# Patient Record
Sex: Male | Born: 1990 | Race: Black or African American | Hispanic: No | Marital: Single | Smoking: Never smoker
Health system: Southern US, Community
[De-identification: ages and names within clinical notes are randomized; demographics above are authoritative.]

## PROBLEM LIST (undated history)

## (undated) DIAGNOSIS — T4145XA Adverse effect of unspecified anesthetic, initial encounter: Secondary | ICD-10-CM

## (undated) DIAGNOSIS — R011 Cardiac murmur, unspecified: Secondary | ICD-10-CM

## (undated) DIAGNOSIS — D573 Sickle-cell trait: Secondary | ICD-10-CM

## (undated) DIAGNOSIS — I10 Essential (primary) hypertension: Secondary | ICD-10-CM

## (undated) DIAGNOSIS — T8859XA Other complications of anesthesia, initial encounter: Secondary | ICD-10-CM

## (undated) HISTORY — DX: Cardiac murmur, unspecified: R01.1

---

## 2011-03-07 ENCOUNTER — Emergency Department (HOSPITAL_COMMUNITY)
Admission: EM | Admit: 2011-03-07 | Discharge: 2011-03-07 | Disposition: A | Discharge status: Home / Self Care | Payer: BC Managed Care – PPO | Source: Home / Self Care | Attending: Family Medicine | Admitting: Family Medicine

## 2011-03-07 ENCOUNTER — Encounter (HOSPITAL_COMMUNITY): Payer: Self-pay | Admitting: *Deleted

## 2011-03-07 DIAGNOSIS — I10 Essential (primary) hypertension: Secondary | ICD-10-CM

## 2011-03-07 DIAGNOSIS — B86 Scabies: Secondary | ICD-10-CM

## 2011-03-07 LAB — POCT URINALYSIS DIP (DEVICE)
Bilirubin Urine: NEGATIVE
Glucose, UA: NEGATIVE mg/dL
Hgb urine dipstick: NEGATIVE
Leukocytes, UA: NEGATIVE
Nitrite: NEGATIVE
Urobilinogen, UA: 2 mg/dL — ABNORMAL HIGH (ref 0.0–1.0)
pH: 8.5 — ABNORMAL HIGH (ref 5.0–8.0)

## 2011-03-07 LAB — POCT I-STAT, CHEM 8
Calcium, Ion: 1.26 mmol/L (ref 1.12–1.32)
Chloride: 105 mEq/L (ref 96–112)
Glucose, Bld: 85 mg/dL (ref 70–99)
HCT: 44 % (ref 39.0–52.0)
Hemoglobin: 15 g/dL (ref 13.0–17.0)

## 2011-03-07 MED ORDER — PERMETHRIN 5 % EX CREA: TOPICAL_CREAM | CUTANEOUS | Status: AC

## 2011-03-07 MED ORDER — HYDROCHLOROTHIAZIDE 25 MG PO TABS: 25.0000 mg | ORAL_TABLET | Freq: Every day | ORAL | Status: DC

## 2011-03-07 MED ORDER — PERMETHRIN 5 % EX CREA: TOPICAL_CREAM | Freq: Once | CUTANEOUS | Status: DC

## 2011-03-07 NOTE — ED Notes (Signed)
Pt  Reports  A  Rash        X  3  Weeks        With  Small  Lesions  Throughout        No  Known  Causative  Agents             No  New  meds        No  resp  Distress   Or  Any  Angio  Edema

## 2011-03-07 NOTE — ED Notes (Signed)
Pt  States  Several  Years  Ago  By  [facility] [facility]  Dr  That  His  bp  Was  High       But    Nothing  Was  Done

## 2011-03-07 NOTE — ED Provider Notes (Signed)
History     CSN: [id number]  Arrival date & time 03/07/11  1031   First MD Initiated Contact with Patient 03/07/11 1231      Chief Complaint  Patient presents with  . Rash    (Consider location/radiation/quality/duration/timing/severity/associated sxs/prior treatment) HPI Comments: Itching rash that is generalized x 3 wks. Developing lesion in web spaces of fingers. No treatment pta.   The history is provided by the patient.    History reviewed. No pertinent past medical history.  History reviewed. No pertinent past surgical history.  Family History  Problem Relation Age of Onset  . Hypertension Mother     History  Substance Use Topics  . Smoking status: Not on file  . Smokeless tobacco: Not on file  . Alcohol Use:       Review of Systems  Constitutional: Negative.   HENT: Negative.   Cardiovascular: Negative.   Gastrointestinal: Negative.   Genitourinary: Negative.   Musculoskeletal: Negative.   Neurological: Negative.     Allergies  Monoclonal antibodies and Penicillins  Home Medications   Current Outpatient Rx  Name Route Sig Dispense Refill  . DIPHENHYDRAMINE HCL 25 MG PO CAPS Oral Take 25 mg by mouth every 6 (six) hours as needed.      BP 166/110  Pulse 62  Temp(Src) 98.8 F (37.1 C) (Oral)  Resp 16  SpO2 99%  Physical Exam  Nursing note and vitals reviewed. Constitutional: He appears well-developed and well-nourished. No distress.       Blood pressure noted to be elevated. Will recheck  HENT:  Head: Normocephalic and atraumatic.  Neck: Normal range of motion. Neck supple. No thyromegaly present.  Cardiovascular: Normal rate and regular rhythm.   Pulmonary/Chest: Effort normal and breath sounds normal.  Lymphadenopathy:    He has no cervical adenopathy.  Skin: Skin is warm and dry.       Rash in web spaces on abd and thighs.    ED Course  Procedures (including critical care time)  Labs Reviewed  POCT URINALYSIS DIP (DEVICE) -  Abnormal; Notable for the following:    pH 8.5 (*)    Urobilinogen, UA 2.0 (*)    All other components within normal limits   No results found.   1. Scabies   2. Hypertension       MDM          Randa Spike, MD 03/07/11 1325

## 2011-06-22 ENCOUNTER — Observation Stay (HOSPITAL_COMMUNITY)
Admission: EM | Admit: 2011-06-22 | Discharge: 2011-06-23 | Disposition: A | Discharge status: Home / Self Care | Payer: BC Managed Care – PPO | Source: Ambulatory Visit | Attending: Emergency Medicine | Admitting: Emergency Medicine

## 2011-06-22 ENCOUNTER — Encounter (HOSPITAL_COMMUNITY): Payer: Self-pay | Admitting: Emergency Medicine

## 2011-06-22 DIAGNOSIS — D573 Sickle-cell trait: Secondary | ICD-10-CM | POA: Insufficient documentation

## 2011-06-22 DIAGNOSIS — N4830 Priapism, unspecified: Principal | ICD-10-CM | POA: Insufficient documentation

## 2011-06-22 HISTORY — DX: Essential (primary) hypertension: I10

## 2011-06-22 HISTORY — DX: Sickle-cell trait: D57.3

## 2011-06-22 MED ORDER — TERBUTALINE SULFATE 1 MG/ML IJ SOLN
0.2500 mg | Freq: Once | INTRAMUSCULAR | Status: AC
  Administered 2011-06-23: 0.25 mg via SUBCUTANEOUS
  Filled 2011-06-22: qty 1

## 2011-06-22 MED ORDER — HYDROMORPHONE HCL PF 1 MG/ML IJ SOLN
1.0000 mg | Freq: Once | INTRAMUSCULAR | Status: AC
  Administered 2011-06-23: 1 mg via INTRAVENOUS
  Filled 2011-06-22: qty 1

## 2011-06-22 NOTE — ED Notes (Addendum)
Pt reports having erection X 2 days unable to get to go away, denies abnormal discharge and denies dysuria

## 2011-06-22 NOTE — ED Provider Notes (Signed)
History     CSN: [id number]  Arrival date & time 06/22/11  2309   First MD Initiated Contact with Patient 06/22/11 2331      Chief Complaint  Patient presents with  . Priapism     (Consider location/radiation/quality/duration/timing/severity/associated sxs/prior treatment) HPI Comments: 21 year old male presents with a history of penile erection which has been present for longer than 36 hours. This was acute in onset, persistent, nothing makes this better or worse, the patient denies any substance abuse, is on no home or daily medications and does not have sickle cell disease though he does have sickle cell trait. This is associated with pain, he has had no pain medication prior to arrival. He denies a history of having this in the past.  He denies fevers, chills nausea vomiting dysuria diarrhea or urethral discharge. He has no testicular pain  The history is provided by the patient.    Past Medical History  Diagnosis Date  . Hypertension   . Sickle cell trait     History reviewed. No pertinent past surgical history.  Family History  Problem Relation Age of Onset  . Hypertension Mother     History  Substance Use Topics  . Smoking status: Never Smoker   . Smokeless tobacco: Not on file  . Alcohol Use: No      Review of Systems  Constitutional: Negative for fever and chills.  Gastrointestinal: Negative for nausea, vomiting and diarrhea.  Genitourinary: Positive for penile swelling and penile pain. Negative for hematuria, discharge, scrotal swelling, genital sores and testicular pain.       Priapism  Musculoskeletal: Negative for back pain.  Skin: Negative for rash.    Allergies  Monoclonal antibodies and Penicillins  Home Medications   Current Outpatient Rx  Name Route Sig Dispense Refill  . DIPHENHYDRAMINE HCL 25 MG PO CAPS Oral Take 25 mg by mouth every 6 (six) hours as needed.    Marland Kitchen HYDROCHLOROTHIAZIDE 25 MG PO TABS Oral Take 1 tablet (25 mg total) by mouth  daily. 30 tablet 0    BP 149/106  Pulse 52  Temp(Src) 98.1 F (36.7 C) (Oral)  Resp 16  SpO2 99%  Physical Exam  Constitutional: He appears well-developed and well-nourished. No distress.  HENT:  Head: Normocephalic and atraumatic.  Eyes: Conjunctivae are normal. No scleral icterus.  Pulmonary/Chest: Effort normal. No respiratory distress.  Abdominal: Soft. There is no tenderness.  Genitourinary:       Rigid enlarged penis without uretheral d/c, scrotal swelling or testicular tenderness  Musculoskeletal: He exhibits no edema and no tenderness.  Neurological: He is alert. Coordination normal.  Skin: Skin is warm. No rash noted. He is not diaphoretic.    ED Course  Procedures (including critical care time)  Labs Reviewed  POCT I-STAT 3, BLOOD GAS (G3P V) - Abnormal; Notable for the following:    pH, Ven 7.302 (*)    pCO2, Ven 50.7 (*)    pO2, Ven 58.0 (*)    Bicarbonate 25.0 (*)    All other components within normal limits  LAB REPORT - SCANNED   No results found.   1. Priapism       MDM  Priapism, unknown etiology - denies meds / drugs / stimulation - has SS train only.  Otherwise benign appearance.  Terbutaline ordered at 11:35   Attempted phenylephrine injections, no significant improvement in erection, discussed with urologist Dr. Annabell Howells, who will come to see the patient. IV fluids being given  Vida Roller, MD 06/24/11 [phone]

## 2011-06-23 ENCOUNTER — Encounter (HOSPITAL_COMMUNITY): Payer: Self-pay | Admitting: *Deleted

## 2011-06-23 DIAGNOSIS — N4830 Priapism, unspecified: Principal | ICD-10-CM | POA: Diagnosis present

## 2011-06-23 LAB — POCT I-STAT 3, VENOUS BLOOD GAS (G3P V)
Acid-base deficit: 2 mmol/L (ref 0.0–2.0)
pH, Ven: 7.302 — ABNORMAL HIGH (ref 7.250–7.300)

## 2011-06-23 MED ORDER — SODIUM CHLORIDE 0.9 % IV SOLN
Freq: Once | INTRAVENOUS | Status: AC
  Administered 2011-06-23: 10:00:00 via INTRAVENOUS

## 2011-06-23 MED ORDER — HYDROMORPHONE HCL PF 1 MG/ML IJ SOLN
1.0000 mg | Freq: Once | INTRAMUSCULAR | Status: AC
  Administered 2011-06-23: 1 mg via INTRAVENOUS
  Filled 2011-06-23: qty 1

## 2011-06-23 MED ORDER — PHENYLEPHRINE 200 MCG/ML FOR PRIAPISM / HYPOTENSION
50.0000 ug | Freq: Once | INTRAMUSCULAR | Status: AC
  Administered 2011-06-23: 50 ug via INTRAVENOUS
  Filled 2011-06-23: qty 50

## 2011-06-23 MED ORDER — ONDANSETRON HCL 4 MG/2ML IJ SOLN
4.0000 mg | INTRAMUSCULAR | Status: DC | PRN
  Administered 2011-06-23: 4 mg via INTRAVENOUS
  Filled 2011-06-23: qty 2

## 2011-06-23 MED ORDER — SODIUM CHLORIDE 0.9 % IV BOLUS (SEPSIS)
1000.0000 mL | Freq: Once | INTRAVENOUS | Status: AC
  Administered 2011-06-23: 1000 mL via INTRAVENOUS

## 2011-06-23 MED ORDER — PHENYLEPHRINE 200 MCG/ML FOR PRIAPISM / HYPOTENSION
500.0000 ug | INTRAMUSCULAR | Status: DC | PRN
  Administered 2011-06-23: 500 ug via INTRACAVERNOUS
  Filled 2011-06-23: qty 50

## 2011-06-23 MED ORDER — TERBUTALINE SULFATE 1 MG/ML IJ SOLN
0.5000 mg | Freq: Once | INTRAMUSCULAR | Status: AC
  Administered 2011-06-23: 0.5 mg via SUBCUTANEOUS
  Filled 2011-06-23: qty 1

## 2011-06-23 MED ORDER — HYDROMORPHONE HCL PF 1 MG/ML IJ SOLN
1.0000 mg | Freq: Once | INTRAMUSCULAR | Status: DC
  Filled 2011-06-23: qty 1

## 2011-06-23 MED ORDER — LIDOCAINE-EPINEPHRINE 1 %-1:100000 IJ SOLN
INTRAMUSCULAR | Status: AC
  Filled 2011-06-23: qty 1

## 2011-06-23 NOTE — ED Notes (Signed)
Pt's erection has not subsided. MD aware.

## 2011-06-23 NOTE — ED Provider Notes (Signed)
Patient care resumed from Dr. Hyacinth Meeker.  Patient presented to emergency department with priapism and hypertension.  Dr. Annabell Howells has evaluated patient in the emergency department and attempted irrigation.  Patient still in pain with the errect penis.  I have paged and spoken with Dr. Isabel Caprice of urology who states that he is currently at [facility] but will be heading to [facility] for a reattempt.  Patient will be moved to CDU for observation and I have ordered phenylephrine, 18-gauge butterfly, normal saline flushes and an irrigation kit to be prepared at patient's bedside for Dr. Ellin Goodie arrival.  Patient's pain is currently being managed with Dilaudid.  10:41 AM Pt still with erection. Dr Despina Pole questions high flow pressure priapism and pt is likely to be transferred to [facility] for IR guided irrigation.pt pain being managed in ED.   11:54 AM [place] Urologist is not comfortable with the procedure needed. Dr. Isabel Caprice is to speak with the urologist at [facility]. Another Dilaudid ordered.  Pt care resumed by catherine Sawdey PA-C who will work on transfer to [facility].    Jaci Carrel, [place] 06/24/11 [phone]

## 2011-06-23 NOTE — Progress Notes (Addendum)
Patient ID: Dustin Mendoza, male   DOB: Nov 30, 1990, 20 y.o.   MRN: [id number]   Subjective: Patient reports ongoing priapism x48 hours. Full consult an initial procedure note done by Dr. Bjorn Mendoza our practice.  Objective: Vital signs in last 24 hours: Temp:  [98.1 F (36.7 C)-99 F (37.2 C)] 98.1 F (36.7 C) (05/26 0937) Pulse Rate:  [48-65] 59  (05/26 1049) Resp:  [14-20] 16  (05/26 1049) BP: (147-180)/(97-123) 158/107 mmHg (05/26 1049) SpO2:  [99 %-100 %] 99 % (05/26 1049)  Intake/Output from previous day: 05/25 0701 - 05/26 0700 In: -  Out: 400 [Urine:400] Intake/Output this shift: Total I/O In: 1000 [I.V.:1000] Out: 400 [Urine:400]  Physical Exam:  Constitutional: Vital signs reviewed. WD WN in NAD   Eyes: PERRL, No scleral icterus.   Cardiovascular: RRR Pulmonary/Chest: Normal effort Abdominal: Soft. Non-tender, non-distended, bowel sounds are normal, no masses, organomegaly, or guarding present.  Genitourinary: Full tumescence with tenderness. No evidence of necrosis ischemia or obvious trauma. Extremities: No cyanosis or edema   Lab Results: No results found for this basename: HGB:3,HCT:3 in the last 72 hours BMET No results found for this basename: NA:2,K:2,CL:2,CO2:2,GLUCOSE:2,BUN:2,CREATININE:2,CALCIUM:2 in the last 72 hours No results found for this basename: LABPT:3,INR:3 in the last 72 hours No results found for this basename: LABURIN:1 in the last 72 hours No results found for this or any previous visit.  Studies/Results: No results found.  Assessment/Plan:   We found that Dustin Mendoza had 48 hours of priapism with full tumescence. While he is sickle trait there were no other obvious risk factors for priapism. He denied any penile trauma and had no illicit drug use or other systemic medical problems. We initiated his care with additional assessment with aspiration of the corpora. We found the blood to be under extremely high pressure with a stream of  blood leaving the 18-gauge needle and shooting out at least 8-10 inches. The blood did not appear as dark as one would expect with a typical ischemic priapism. Some additional irrigation was performed with saline. We did get a partial detumescence for just a few seconds and then the penis with become completely rigid again. Phenylephrine had previously been tried by both the ER physicians as well as my partner. A blood gas was obtained on the corporal blood and unfortunately fell in the equivocal zone between an ischemic and nonischemic state. This clearly however was not a typical ischemic priapism in my estimation. I could feel a pulsation in his penis and was quite concerned about the amount of pressure that I was seen within the corporal bodies and again was concerned that this may indeed be more of a high flow state. The patient clearly had not responded to multiple attempts at irrigation and also phenylephrine. I do not however feel comfortable taking him to the OR for a typical shunt procedure. I have limited to no experience doing shunt procedures for priapism. I felt special comfortable taken to surgery given the question of the etiology and the possibility that this may be a high flow priapism I did not feel that interventional radiology would feel comfortable emboli seen in a situation like this again without a more definitive diagnosis and also given the patient's current age. I did discuss this with the urology resident at [facility]. I explained my concerns about this case and felt that this would be better managed at definitive academic institution. They've agreed to accept the transfer.   LOS: 1 day   Dustin Mendoza  S 06/23/2011, 11:35 AM   I spent approximately an hour at the patient's bedside and then an additional 30 minutes facilitating this patient's transfer.  After spending considerable time discussing things with the chief resident at [facility] we were told that this patient would be  accepted in transfer. They socially call me back approximately 45 minutes later and told me that the attending physician is a pediatric urologist he does not feel comfortable managing this problem and suggested we try a different academic center.

## 2011-06-23 NOTE — ED Notes (Signed)
PT's penis is still erect but less swelling than presentation.

## 2011-06-23 NOTE — ED Notes (Signed)
Erection has not improved; MD aware.

## 2011-06-23 NOTE — Procedures (Signed)
  Procedure:  Aspiration and irrigation of corpora.  Dx: Priapism with sickle cell trait.  Surgeon: Bjorn Pippin MD.  Anesth: Local with 1% lidocaine with epi.  Comp: none.  Indications: 20yo BM with priapism that didn't respond to phenylepherine injections.  Procedure:  The penis was prepped with betadine and draped with towels.  A 14fr butterfly was placed in the left mid corpora and attached to a 3 way stop cock with a 20cc syringe and 10cc syringe with 10cc of 1% lidocaine with epi.  2cc of lidocaine was injected and then after an interval the corpora was aspirated.  The aspirated blood was quite dark.  This process was repeat using about 5cc of the lidocaine solution.  About 100cc of blood was removed with partial detumescence.   I then placed a second butterfly in the right corpora and repeated the process with the remaining lidocaine.  About 40cc of blood was removed with further detumescence.  I was only about to get about a 20% reduction in the erection.  I then injected 1.5cc of phenylepherine 1/100000 in each corpora.   There was mild further detumescence with that.   I removed the needles and placed a dressing of kling and coban.  I will have him vigorously hydrated and give him oxygen therapy.  He will be observed and if there is no further improvement, he may need to go to the OR for further management and possible shunting.

## 2011-06-23 NOTE — ED Notes (Signed)
MD at bedside. Priapism kit retrieved; RN at bedside to assist. Pt tolerating well.

## 2011-06-23 NOTE — Consult Note (Signed)
Reason for Consult:Priapism Referring Physician: Eber Hong MD   [name] is an 21 y.o. male.  HPI: Dustin Mendoza is a 21 yo BM with sickle cell trait who had failure of detumescence about 36hrs ago.  He denies drug use or high risk medications.   He tried ice packs without success and came to the ER where phenylepherine injections were unsuccessfull.  He continues to have a painful erection.  He is voiding without complaints.  Past Medical History  Diagnosis Date  . Hypertension     History reviewed. No pertinent past surgical history.  Family History  Problem Relation Age of Onset  . Hypertension Mother     Social History:  reports that he has never smoked. He does not have any smokeless tobacco history on file. He reports that he does not drink alcohol or use illicit drugs.  Allergies:  Allergies  Allergen Reactions  . Monoclonal Antibodies   . Penicillins     Medications: I have reviewed the patient's current medications.  He takes an antihypertensive but doesn't know what it is.  No results found for this or any previous visit (from the past 48 hour(s)).  No results found.  Review of Systems  Genitourinary:       Penile pain  All other systems reviewed and are negative.   Blood pressure 147/97, pulse 65, temperature 99 F (37.2 C), temperature source Oral, resp. rate 18, SpO2 99.00%. Physical Exam  Constitutional: He is oriented to person, place, and time. He appears well-developed and well-nourished.  HENT:  Head: Normocephalic and atraumatic.  Cardiovascular: Normal rate and regular rhythm.   Respiratory: Effort normal and breath sounds normal.  GI: Soft. There is no tenderness.  Genitourinary: Penile tenderness (with a full erection that is tender to touch) present.  Musculoskeletal: Normal range of motion.  Neurological: He is alert and oriented to person, place, and time.  Skin: Skin is warm and dry.  Psychiatric: He has a normal mood and affect.     Assessment/Plan: Priapism with sickle cell trait.  I aspirated and irrigated his corpora with only mild detumescence. I will have him vigorously hydrated and give him O2 and if the erection doesn't resolve, he will need to be taken to the OR for further treatment.   Dustin Mendoza J 06/23/2011, 6:36 AM

## 2011-06-23 NOTE — ED Notes (Signed)
MD at bedside. 

## 2011-06-23 NOTE — ED Notes (Signed)
MD done with pt; oxygen placed; IV fluids placed.

## 2011-06-24 NOTE — ED Provider Notes (Signed)
Medical screening examination/treatment/procedure(s) were performed by non-physician practitioner and as supervising physician I was immediately available for consultation/collaboration.   Carleene Cooper III, MD 06/24/11 [phone]

## 2013-01-28 HISTORY — PX: PRIAPISM REPAIR: SHX6040

## 2013-08-09 ENCOUNTER — Ambulatory Visit: Payer: BC Managed Care – PPO

## 2013-08-09 ENCOUNTER — Ambulatory Visit (INDEPENDENT_AMBULATORY_CARE_PROVIDER_SITE_OTHER): Payer: BC Managed Care – PPO | Admitting: Family Medicine

## 2013-08-09 VITALS — BP 150/84 | HR 56 | Temp 97.9°F | Resp 16 | Ht 69.0 in | Wt 181.0 lb

## 2013-08-09 DIAGNOSIS — R079 Chest pain, unspecified: Secondary | ICD-10-CM

## 2013-08-09 DIAGNOSIS — I10 Essential (primary) hypertension: Secondary | ICD-10-CM

## 2013-08-09 DIAGNOSIS — S20219A Contusion of unspecified front wall of thorax, initial encounter: Secondary | ICD-10-CM

## 2013-08-09 MED ORDER — CYCLOBENZAPRINE HCL 5 MG PO TABS: 5.0000 mg | ORAL_TABLET | Freq: Three times a day (TID) | ORAL | Status: DC | PRN

## 2013-08-09 MED ORDER — AMLODIPINE BESYLATE 5 MG PO TABS: 5.0000 mg | ORAL_TABLET | Freq: Every day | ORAL | Status: DC

## 2013-08-09 MED ORDER — IBUPROFEN 600 MG PO TABS: 600.0000 mg | ORAL_TABLET | Freq: Three times a day (TID) | ORAL | Status: DC | PRN

## 2013-08-09 NOTE — Patient Instructions (Signed)
Hypertension Hypertension is another name for high blood pressure. High blood pressure forces your heart to work harder to pump blood. A blood pressure reading has two numbers, which includes a higher number over a lower number (example: 110/72). HOME CARE   Have your blood pressure rechecked by your doctor.  Only take medicine as told by your doctor. Follow the directions carefully. The medicine does not work as well if you skip doses. Skipping doses also puts you at risk for problems.  Do not smoke.  Monitor your blood pressure at home as told by your doctor. GET HELP IF:  You think you are having a reaction to the medicine you are taking.  You have repeat headaches or feel dizzy.  You have puffiness (swelling) in your ankles.  You have trouble with your vision. GET HELP RIGHT AWAY IF:   You get a very bad headache and are confused.  You feel weak, numb, or faint.  You get chest or belly (abdominal) pain.  You throw up (vomit).  You cannot breathe very well. MAKE SURE YOU:   Understand these instructions.  Will watch your condition.  Will get help right away if you are not doing well or get worse. Document Released: 07/03/2007 Document Revised: 01/19/2013 Document Reviewed: 11/06/2012 [facility] Patient Information 2015 [place], [place]. This information is not intended to replace advice given to you by your health care provider. Make sure you discuss any questions you have with your health care provider. Chest Contusion A chest contusion is a deep bruise on your chest area. Contusions are the result of an injury that caused bleeding under the skin. A chest contusion may involve bruising of the skin, muscles, or ribs. The contusion may turn blue, purple, or yellow. Minor injuries will give you a painless contusion, but more severe contusions may stay painful and swollen for a few weeks. CAUSES  A contusion is usually caused by a blow, trauma, or direct force to an area of  the body. SYMPTOMS   Swelling and redness of the injured area.  Discoloration of the injured area.  Tenderness and soreness of the injured area.  Pain. DIAGNOSIS  The diagnosis can be made by taking a history and performing a physical exam. An X-ray, CT scan, or MRI may be needed to determine if there were any associated injuries, such as broken bones (fractures) or internal injuries. TREATMENT  Often, the best treatment for a chest contusion is resting, icing, and applying cold compresses to the injured area. Deep breathing exercises may be recommended to reduce the risk of pneumonia. Over-the-counter medicines may also be recommended for pain control. HOME CARE INSTRUCTIONS   Put ice on the injured area.  Put ice in a plastic bag.  Place a towel between your skin and the bag.  Leave the ice on for 15-20 minutes, 03-04 times a day.  Only take over-the-counter or prescription medicines as directed by your caregiver. Your caregiver may recommend avoiding anti-inflammatory medicines (aspirin, ibuprofen, and naproxen) for 48 hours because these medicines may increase bruising.  Rest the injured area.  Perform deep-breathing exercises as directed by your caregiver.  Stop smoking if you smoke.  Do not lift objects over 5 pounds (2.3 kg) for 3 days or longer if recommended by your caregiver. SEEK IMMEDIATE MEDICAL CARE IF:   You have increased bruising or swelling.  You have pain that is getting worse.  You have difficulty breathing.  You have dizziness, weakness, or fainting.  You have blood  in your urine or stool.  You cough up or vomit blood.  Your swelling or pain is not relieved with medicines. MAKE SURE YOU:   Understand these instructions.  Will watch your condition.  Will get help right away if you are not doing well or get worse. Document Released: 10/09/2000 Document Revised: 10/09/2011 Document Reviewed: 07/08/2011 [facility] Patient Information 2015  [place], [place]. This information is not intended to replace advice given to you by your health care provider. Make sure you discuss any questions you have with your health care provider.

## 2013-08-09 NOTE — Progress Notes (Signed)
Chief Complaint:  Chief Complaint  Patient presents with  . Chest Injury    from playing basketball, ran into another player, right side     HPI: Dustin Mendoza is a 23 y.o. male who is here for midsubsternal CP radiatin gto right side since yesterday , occurred after was palying basketball and another player elbowed him in the chest. He has sharp and throbbing pain, worse with ROM, achey when he tried to go to sleep last night. No SOB, no heart palpations. No nausea or vomiting. He has tried extra strength generic acetaminophen and painr eleiver cream on it. No prior CP. No tobacco use or illicit drugs. 8-9/10 when he rotates his torso , mostly center of chest and radiates to the right  He has a h/o HTN, has been out for a while. He does notwant to come in to get his blood work done regular. HE was on HCTZ previously and it made him dizzy so that also prompted him to stop after his refills ran out BP Readings from Last 3 Encounters:  08/09/13 150/84  06/23/11 149/106  03/07/11 166/110      Past Medical History  Diagnosis Date  . Hypertension   . Sickle cell trait   . Heart murmur    No past surgical history on file. History   Social History  . Marital Status: Single    Spouse Name: N/A    Number of Children: N/A  . Years of Education: N/A   Social History Main Topics  . Smoking status: Never Smoker   . Smokeless tobacco: None  . Alcohol Use: No  . Drug Use: No  . Sexual Activity: None   Other Topics Concern  . None   Social History Narrative  . None   Family History  Problem Relation Age of Onset  . Hypertension Mother    Allergies  Allergen Reactions  . Monoclonal Antibodies   . Penicillins    Prior to Admission medications   Medication Sig Start Date End Date Taking? Authorizing Provider  diphenhydrAMINE (BENADRYL) 25 mg capsule Take 25 mg by mouth every 6 (six) hours as needed.    Historical Provider, MD  hydrochlorothiazide (HYDRODIURIL) 25  MG tablet Take 1 tablet (25 mg total) by mouth daily. 03/07/11 03/06/12  Luna Glasgow, DO     ROS: The patient denies fevers, chills, night sweats, unintentional weight loss,  palpitations, wheezing, dyspnea on exertion, nausea, vomiting, abdominal pain, dysuria, hematuria, melena, numbness, weakness, or tingling.   All other systems have been reviewed and were otherwise negative with the exception of those mentioned in the HPI and as above.    PHYSICAL EXAM: Filed Vitals:   08/09/13 1143  BP: 150/84  Pulse: 56  Temp: 97.9 F (36.6 C)  Resp: 16   Filed Vitals:   08/09/13 1143  Height: 5\' 9"  (1.753 m)  Weight: 181 lb (82.101 kg)   Body mass index is 26.72 kg/(m^2).  General: Alert, no acute distress HEENT:  Normocephalic, atraumatic, oropharynx patent. EOMI, PERRLA Cardiovascular:  Regular rate and rhythm, no rubs murmurs or gallops.  No Carotid bruits, radial pulse intact. No pedal edema. + chest wall tenderness at xiphoid and right anterior chest Respiratory: Clear to auscultation bilaterally.  No wheezes, rales, or rhonchi.  No cyanosis, no use of accessory musculature GI: No organomegaly, abdomen is soft and non-tender, positive bowel sounds.  No masses. Skin: No rashes. Neurologic: Facial musculature symmetric. Psychiatric: Patient is appropriate throughout  our interaction. Lymphatic: No cervical lymphadenopathy Musculoskeletal: Gait intact.   LABS: Results for orders placed during the hospital encounter of 06/22/11  POCT I-STAT 3, BLOOD GAS (G3P V)      Result Value Ref Range   pH, Ven 7.302 (*) 7.250 - 7.300   pCO2, Ven 50.7 (*) 45.0 - 50.0 mmHg   pO2, Ven 58.0 (*) 30.0 - 45.0 mmHg   Bicarbonate 25.0 (*) 20.0 - 24.0 mEq/L   TCO2 27  0 - 100 mmol/L   O2 Saturation 86.0     Acid-base deficit 2.0  0.0 - 2.0 mmol/L   Drawn by :MD     Sample type MIXED VENOUS SAMPLE       EKG/XRAY:   Primary read interpreted by Dr. Marin Comment at [facility]. Please comment on right perihilar  vertical hypodense line  Borderline  cardiomegaly, no effusion, infiltrates   ASSESSMENT/PLAN: Encounter Diagnoses  Name Primary?  . Chest pain, unspecified chest pain type Yes  . Essential hypertension   . Chest wall contusion, unspecified laterality, initial encounter    HE declined blood work so will not give lisinopril  HE was dizzy on HCTZ He would like norvasc, gave him HR and also BP precautions and parameters.  Chest wall pain is secondary to contusion , rx ibuprofen and also flexeril, declined anythign stronger F/u in 1 month    Gross sideeffects, risk and benefits, and alternatives of medications d/w patient. Patient is aware that all medications have potential sideeffects and we are unable to predict every sideeffect or drug-drug interaction that may occur.  Brookelyn Gaynor, [place], DO 08/09/2013 1:15 PM

## 2013-11-26 ENCOUNTER — Encounter (HOSPITAL_COMMUNITY): Payer: Self-pay | Admitting: Emergency Medicine

## 2013-11-26 ENCOUNTER — Emergency Department (HOSPITAL_COMMUNITY)
Admission: EM | Admit: 2013-11-26 | Discharge: 2013-11-26 | Disposition: A | Discharge status: Home / Self Care | Payer: BC Managed Care – PPO | Attending: Emergency Medicine | Admitting: Emergency Medicine

## 2013-11-26 DIAGNOSIS — R011 Cardiac murmur, unspecified: Secondary | ICD-10-CM | POA: Insufficient documentation

## 2013-11-26 DIAGNOSIS — Z862 Personal history of diseases of the blood and blood-forming organs and certain disorders involving the immune mechanism: Secondary | ICD-10-CM | POA: Insufficient documentation

## 2013-11-26 DIAGNOSIS — J029 Acute pharyngitis, unspecified: Secondary | ICD-10-CM | POA: Diagnosis present

## 2013-11-26 DIAGNOSIS — Z79899 Other long term (current) drug therapy: Secondary | ICD-10-CM | POA: Diagnosis not present

## 2013-11-26 DIAGNOSIS — I10 Essential (primary) hypertension: Secondary | ICD-10-CM | POA: Diagnosis not present

## 2013-11-26 DIAGNOSIS — Z880 Allergy status to penicillin: Secondary | ICD-10-CM | POA: Insufficient documentation

## 2013-11-26 LAB — RAPID STREP SCREEN (MED CTR MEBANE ONLY): Streptococcus, Group A Screen (Direct): NEGATIVE

## 2013-11-26 MED ORDER — HYDROCODONE-ACETAMINOPHEN 7.5-325 MG/15ML PO SOLN: 15.0000 mL | Freq: Four times a day (QID) | ORAL | Status: DC | PRN

## 2013-11-26 MED ORDER — ACETAMINOPHEN 160 MG/5ML PO SOLN
650.0000 mg | Freq: Once | ORAL | Status: AC
  Administered 2013-11-26: 650 mg via ORAL
  Filled 2013-11-26: qty 20.3

## 2013-11-26 NOTE — Discharge Instructions (Signed)

## 2013-11-26 NOTE — ED Provider Notes (Signed)
CSN: [id number]     Arrival date & time 11/26/13  1345 History  This chart was scribed for non-physician practitioner, Lorre Munroe, PA-C,working with Richarda Blade, MD, by Marlowe Kays, ED Scribe. This patient was seen in room TR09C/TR09C and the patient's care was started at 2:01 PM.  Chief Complaint  Patient presents with  . Sore Throat   The history is provided by the patient. No language interpreter was used.   HPI Comments:  Dustin Mendoza is a 23 y.o. male with PMH of HTN who presents to the Emergency Department complaining of a severe sore throat that began upon waking this morning. He states his tonsils are swollen as well. He has not taken anything for his symptoms. He states swallowing makes the pain worse. No alleviating factors were reported. Denies fever, chills, nausea, vomiting, cough or inability to swallow.  Past Medical History  Diagnosis Date  . Hypertension   . Sickle cell trait   . Heart murmur    History reviewed. No pertinent past surgical history. Family History  Problem Relation Age of Onset  . Hypertension Mother    History  Substance Use Topics  . Smoking status: Never Smoker   . Smokeless tobacco: Not on file  . Alcohol Use: No    Review of Systems  Constitutional: Negative for fever and chills.  HENT: Positive for sore throat. Negative for trouble swallowing.   Respiratory: Negative for cough and shortness of breath.   Gastrointestinal: Negative for nausea and vomiting.    Allergies  Monoclonal antibodies and Penicillins  Home Medications   Prior to Admission medications   Medication Sig Start Date End Date Taking? Authorizing Provider  amLODipine (NORVASC) 5 MG tablet Take 1 tablet (5 mg total) by mouth daily. Take 1/2 tab daily for 1 week then if blood pressure and heart rate tolerates then increase to 1 tab po daily. BP goal is less than 140/90 08/09/13   Thao P Le, DO  cyclobenzaprine (FLEXERIL) 5 MG tablet Take 1 tablet (5 mg  total) by mouth 3 (three) times daily as needed for muscle spasms. 08/09/13   Thao P Le, DO  ibuprofen (ADVIL,MOTRIN) 600 MG tablet Take 1 tablet (600 mg total) by mouth every 8 (eight) hours as needed. 08/09/13   Thao P Le, DO   Triage Vitals: BP 162/106  Pulse 112  Temp(Src) 97.9 F (36.6 C)  Resp 18  Wt 185 lb (83.915 kg)  SpO2 98% Physical Exam  Nursing note and vitals reviewed. Constitutional: He is oriented to person, place, and time. He appears well-developed and well-nourished.  HENT:  Head: Normocephalic and atraumatic.  Mouth/Throat: Uvula is midline and mucous membranes are normal. Posterior oropharyngeal erythema present. No tonsillar abscesses.  Oropharynx erythematous. No exudate. No peritonsillar abscess. Airway intact.  Eyes: EOM are normal.  Neck: Normal range of motion.  Cardiovascular: Normal rate, regular rhythm and normal heart sounds.  Exam reveals no gallop and no friction rub.   No murmur heard. Pulmonary/Chest: Effort normal and breath sounds normal. No respiratory distress. He has no wheezes. He has no rales.  Musculoskeletal: Normal range of motion.  Neurological: He is alert and oriented to person, place, and time.  Skin: Skin is warm and dry.  Psychiatric: He has a normal mood and affect. His behavior is normal.    ED Course  Procedures (including critical care time) DIAGNOSTIC STUDIES: Oxygen Saturation is 98% on RA, normal by my interpretation.   COORDINATION OF CARE: 2:03 PM-  Will order rapid strep test and order Tylenol for pain. Pt verbalizes understanding and agrees to plan.  Medications  acetaminophen (TYLENOL) solution 650 mg (650 mg Oral Given 11/26/13 1414)    Labs Review Labs Reviewed  RAPID STREP SCREEN  CULTURE, GROUP A STREP    Imaging Review No results found.   EKG Interpretation None      MDM   Final diagnoses:  Viral pharyngitis   Pt afebrile without tonsillar exudate, negative strep. Presents with mild cervical  lymphadenopathy, & dysphagia; diagnosis of viral pharyngitis. No abx indicated. DC w symptomatic tx for pain  Pt does not appear dehydrated, but did discuss importance of water rehydration. Presentation non concerning for PTA or infxn spread to soft tissue. No trismus or uvula deviation. Specific return precautions discussed. Pt able to drink water in ED without difficulty with intact air way. Recommended PCP follow up.   I personally performed the services described in this documentation, which was scribed in my presence. The recorded information has been reviewed and is accurate.    Montine Circle, PA-C 11/26/13 [phone]

## 2013-11-26 NOTE — ED Notes (Signed)
Per pt sts sore throat since this am. sts hard to swallow.

## 2013-11-28 LAB — CULTURE, GROUP A STREP

## 2015-11-03 ENCOUNTER — Encounter (HOSPITAL_COMMUNITY): Payer: Self-pay | Admitting: Vascular Surgery

## 2015-11-03 ENCOUNTER — Emergency Department (HOSPITAL_COMMUNITY)
Admission: EM | Admit: 2015-11-03 | Discharge: 2015-11-03 | Disposition: A | Discharge status: Home / Self Care | Payer: BLUE CROSS/BLUE SHIELD | Attending: Emergency Medicine | Admitting: Emergency Medicine

## 2015-11-03 DIAGNOSIS — I10 Essential (primary) hypertension: Secondary | ICD-10-CM | POA: Insufficient documentation

## 2015-11-03 DIAGNOSIS — S0993XA Unspecified injury of face, initial encounter: Secondary | ICD-10-CM | POA: Diagnosis present

## 2015-11-03 DIAGNOSIS — Y999 Unspecified external cause status: Secondary | ICD-10-CM | POA: Diagnosis not present

## 2015-11-03 DIAGNOSIS — Y939 Activity, unspecified: Secondary | ICD-10-CM | POA: Diagnosis not present

## 2015-11-03 DIAGNOSIS — X58XXXA Exposure to other specified factors, initial encounter: Secondary | ICD-10-CM | POA: Insufficient documentation

## 2015-11-03 DIAGNOSIS — Y929 Unspecified place or not applicable: Secondary | ICD-10-CM | POA: Insufficient documentation

## 2015-11-03 DIAGNOSIS — Z79899 Other long term (current) drug therapy: Secondary | ICD-10-CM | POA: Diagnosis not present

## 2015-11-03 NOTE — ED Notes (Signed)
Pt verbalized understanding of d/c instructions and has no furthe questions. Pt stable and NAD. Pt given follow up to ENT

## 2015-11-03 NOTE — ED Triage Notes (Signed)
Pt reports to the ED for eval of injury lingual frenulum. States last week he ate a chicken tender sandwich and it clipped his frenulum and cut it and it was bleeding very bad then today he ate something and it started bleeding again. No bleeding noted at this time.

## 2015-11-03 NOTE — ED Provider Notes (Signed)
[facility] DEPT Provider Note   CSN: [id number] Arrival date & time: 11/03/15  1646  By signing my name below, I, Reola Mosher, attest that this documentation has been prepared under the direction and in the presence of Etta Quill, NP. Electronically Signed: Reola Mosher, ED Scribe. 11/03/15. 6:32 PM.  History   Chief Complaint Chief Complaint  Patient presents with  . Mouth Injury   The history is provided by the patient. No language interpreter was used.  Mouth Injury  This is a new problem. The current episode started yesterday. The problem occurs constantly. The problem has been gradually worsening. Pertinent negatives include no chest pain, no abdominal pain, no headaches and no shortness of breath. The symptoms are aggravated by eating. Nothing relieves the symptoms. He has tried nothing for the symptoms. The treatment provided no relief.   HPI Comments: Dustin Mendoza is a 25 y.o. male who presents to the Emergency Department complaining of wound sustained to the frenulum of the tongue ~1 day ago. Pt reports that he was eating a chicken tender yesterday when it came across the bottom of his tongue, sustaining his wound. He further reports that he was eating a piece of bread this morning, which seemed to worsen the wound to the area. He denies biting his tongue. No treatments were tried prior to coming into the ED. His pain to the area is worsened with eating other foods. No other associated symptoms or complaints.    Past Medical History:  Diagnosis Date  . Heart murmur   . Hypertension   . Sickle cell trait [facility])    Patient Active Problem List   Diagnosis Date Noted  . Priapism 06/23/2011   History reviewed. No pertinent surgical history.  Home Medications    Prior to Admission medications   Medication Sig Start Date End Date Taking? Authorizing Provider  amLODipine (NORVASC) 5 MG tablet Take 1 tablet (5 mg total) by mouth daily. Take 1/2 tab daily  for 1 week then if blood pressure and heart rate tolerates then increase to 1 tab po daily. BP goal is less than 140/90 08/09/13   Thao P Le, DO  cyclobenzaprine (FLEXERIL) 5 MG tablet Take 1 tablet (5 mg total) by mouth 3 (three) times daily as needed for muscle spasms. 08/09/13   Thao P Le, DO  HYDROcodone-acetaminophen (HYCET) 7.5-325 mg/15 ml solution Take 15 mLs by mouth every 6 (six) hours as needed for moderate pain or severe pain. 11/26/13   Montine Circle, PA-C  ibuprofen (ADVIL,MOTRIN) 600 MG tablet Take 1 tablet (600 mg total) by mouth every 8 (eight) hours as needed. 08/09/13   Thao P Le, DO   Family History Family History  Problem Relation Age of Onset  . Hypertension Mother    Social History Social History  Substance Use Topics  . Smoking status: Never Smoker  . Smokeless tobacco: Never Used  . Alcohol use Yes     Comment: occasionally   Allergies   Monoclonal antibodies and Penicillins  Review of Systems Review of Systems  Respiratory: Negative for shortness of breath.   Cardiovascular: Negative for chest pain.  Gastrointestinal: Negative for abdominal pain.  Skin: Positive for wound.  Neurological: Negative for headaches.  All other systems reviewed and are negative.  Physical Exam Updated Vital Signs BP 153/97 (BP Location: Right Arm)   Pulse (!) 51   Temp 98.2 F (36.8 C) (Oral)   Resp 18   SpO2 100%   Physical Exam  Constitutional: He appears well-developed and well-nourished.  HENT:  Head: Normocephalic.  Right Ear: External ear normal.  Left Ear: External ear normal.  Mouth/Throat: Oropharynx is clear and moist. No oropharyngeal exudate.  Has a partially torn frenulum. No active bleeding.   Eyes: Conjunctivae are normal.  Cardiovascular: Normal rate, regular rhythm and normal heart sounds.   No murmur heard. Pulmonary/Chest: Effort normal and breath sounds normal. No respiratory distress. He has no wheezes. He has no rales.  Abdominal: He exhibits  no distension.  Musculoskeletal: Normal range of motion.  Neurological: He is alert.  Skin: Skin is warm and dry.  Psychiatric: He has a normal mood and affect. His behavior is normal.  Nursing note and vitals reviewed.  ED Treatments / Results  DIAGNOSTIC STUDIES: Oxygen Saturation is 100% on RA, normal by my interpretation.   COORDINATION OF CARE: 6:25 PM-Discussed next steps with pt. Pt verbalized understanding and is agreeable with the plan.   Labs (all labs ordered are listed, but only abnormal results are displayed) Labs Reviewed - No data to display  Radiology No results found.  Procedures Procedures   Medications Ordered in ED Medications - No data to display  Initial Impression / Assessment and Plan / ED Course  I have reviewed the triage vital signs and the nursing notes.  Pertinent labs & imaging results that were available during my care of the patient were reviewed by me and considered in my medical decision making (see chart for details).  Clinical Course  Patient with partially torn frenulum at base of tongue. No active bleeding. Supportive care instructions provided. Patient states he would like to have the frenulum completely severed. Referral to ENT provided.  Final Clinical Impressions(s) / ED Diagnoses   Final diagnoses:  Injury of mouth, initial encounter   New Prescriptions Discharge Medication List as of 11/03/2015  7:06 PM    I personally performed the services described in this documentation, which was scribed in my presence. The recorded information has been reviewed and is accurate.     Etta Quill, NP 11/04/15 [id number]    Blanchie Dessert, MD 11/04/15 2135

## 2017-02-03 ENCOUNTER — Emergency Department (HOSPITAL_COMMUNITY)
Admission: EM | Admit: 2017-02-03 | Discharge: 2017-02-03 | Disposition: A | Discharge status: Home / Self Care | Payer: BLUE CROSS/BLUE SHIELD | Attending: Emergency Medicine | Admitting: Emergency Medicine

## 2017-02-03 ENCOUNTER — Other Ambulatory Visit: Payer: Self-pay

## 2017-02-03 ENCOUNTER — Emergency Department (HOSPITAL_COMMUNITY): Payer: BLUE CROSS/BLUE SHIELD

## 2017-02-03 ENCOUNTER — Encounter (HOSPITAL_COMMUNITY): Payer: Self-pay

## 2017-02-03 DIAGNOSIS — I10 Essential (primary) hypertension: Secondary | ICD-10-CM | POA: Insufficient documentation

## 2017-02-03 DIAGNOSIS — R109 Unspecified abdominal pain: Secondary | ICD-10-CM | POA: Diagnosis present

## 2017-02-03 DIAGNOSIS — Q6211 Congenital occlusion of ureteropelvic junction: Secondary | ICD-10-CM | POA: Diagnosis not present

## 2017-02-03 LAB — COMPREHENSIVE METABOLIC PANEL
ALK PHOS: 56 U/L (ref 38–126)
ALT: 11 U/L — ABNORMAL LOW (ref 17–63)
AST: 21 U/L (ref 15–41)
Albumin: 4.1 g/dL (ref 3.5–5.0)
Anion gap: 8 (ref 5–15)
BILIRUBIN TOTAL: 1.2 mg/dL (ref 0.3–1.2)
BUN: 11 mg/dL (ref 6–20)
CO2: 24 mmol/L (ref 22–32)
Calcium: 9.9 mg/dL (ref 8.9–10.3)
Chloride: 105 mmol/L (ref 101–111)
Creatinine, Ser: 1.54 mg/dL — ABNORMAL HIGH (ref 0.61–1.24)
GFR calc Af Amer: >60 mL/min (ref >60)
GFR calc non Af Amer: >60 mL/min (ref >60)
GLUCOSE: 94 mg/dL (ref 65–99)
Potassium: 4.4 mmol/L (ref 3.5–5.1)
Sodium: 137 mmol/L (ref 135–145)
TOTAL PROTEIN: 8.3 g/dL — AB (ref 6.5–8.1)

## 2017-02-03 LAB — CBC
HEMATOCRIT: 41.7 % (ref 39.0–52.0)
Hemoglobin: 13.9 g/dL (ref 13.0–17.0)
MCH: 27.9 pg (ref 26.0–34.0)
MCHC: 33.3 g/dL (ref 30.0–36.0)
MCV: 83.6 fL (ref 78.0–100.0)
Platelets: 250 10*3/uL (ref 150–400)
RBC: 4.99 MIL/uL (ref 4.22–5.81)
RDW: 13.3 % (ref 11.5–15.5)
WBC: 5.9 10*3/uL (ref 4.0–10.5)

## 2017-02-03 LAB — URINALYSIS, ROUTINE W REFLEX MICROSCOPIC
Bilirubin Urine: NEGATIVE
Glucose, UA: NEGATIVE mg/dL
HGB URINE DIPSTICK: NEGATIVE
KETONES UR: NEGATIVE mg/dL
Leukocytes, UA: NEGATIVE
Nitrite: NEGATIVE
PH: 6 (ref 5.0–8.0)
Protein, ur: NEGATIVE mg/dL
Specific Gravity, Urine: 1.015 (ref 1.005–1.030)

## 2017-02-03 LAB — LIPASE, BLOOD: Lipase: 26 U/L (ref 11–51)

## 2017-02-03 MED ORDER — IOPAMIDOL (ISOVUE-300) INJECTION 61%
INTRAVENOUS | Status: AC
  Administered 2017-02-03: 100 mL
  Filled 2017-02-03: qty 100

## 2017-02-03 MED ORDER — ONDANSETRON 4 MG PO TBDP: 4.0000 mg | ORAL_TABLET | Freq: Three times a day (TID) | ORAL | 0 refills | Status: AC | PRN

## 2017-02-03 MED ORDER — OXYCODONE-ACETAMINOPHEN 5-325 MG PO TABS: 1.0000 | ORAL_TABLET | Freq: Four times a day (QID) | ORAL | 0 refills | Status: DC | PRN

## 2017-02-03 NOTE — ED Provider Notes (Signed)
[facility] EMERGENCY DEPARTMENT Provider Note   CSN: [id number] Arrival date & time: 02/03/17  1154     History   Chief Complaint Chief Complaint  Patient presents with  . Abdominal Pain    HPI Dustin Mendoza is a 27 y.o. male.  HPI Patient presents with abdominal pain.  It is in his mid to upper abdomen.  Has had rather frequent episodes of it over the last 3 months.  Has been seen by urgent care with no real significant findings except renal insufficiency.  When the episodes come on the pain gets severe for a day or 2.  Has a decreased appetite with it.  Occasionally has nausea and vomiting.  No diarrhea or constipation.  No blood in the stool or emesis.  No fevers.  No weight loss.  Has not been on any medicines except some nausea medicines for it.  Pain is dull.  It is not associated with eating during or between the episodes. Past Medical History:  Diagnosis Date  . Heart murmur   . Hypertension   . Sickle cell trait [facility])     Patient Active Problem List   Diagnosis Date Noted  . Priapism 06/23/2011    History reviewed. No pertinent surgical history.     Home Medications    Prior to Admission medications   Medication Sig Start Date End Date Taking? Authorizing Provider  amLODipine (NORVASC) 5 MG tablet Take 1 tablet (5 mg total) by mouth daily. Take 1/2 tab daily for 1 week then if blood pressure and heart rate tolerates then increase to 1 tab po daily. BP goal is less than 140/90 08/09/13   Le, Thao P, DO  cyclobenzaprine (FLEXERIL) 5 MG tablet Take 1 tablet (5 mg total) by mouth 3 (three) times daily as needed for muscle spasms. 08/09/13   Le, Thao P, DO  HYDROcodone-acetaminophen (HYCET) 7.5-325 mg/15 ml solution Take 15 mLs by mouth every 6 (six) hours as needed for moderate pain or severe pain. 11/26/13   Montine Circle, PA-C  ibuprofen (ADVIL,MOTRIN) 600 MG tablet Take 1 tablet (600 mg total) by mouth every 8 (eight) hours as needed. 08/09/13    Le, Thao P, DO  ondansetron (ZOFRAN-ODT) 4 MG disintegrating tablet Take 1 tablet (4 mg total) by mouth every 8 (eight) hours as needed for nausea or vomiting. 02/03/17   Davonna Belling, MD  oxyCODONE-acetaminophen (PERCOCET/ROXICET) 5-325 MG tablet Take 1-2 tablets by mouth every 6 (six) hours as needed. 02/03/17   Davonna Belling, MD    Family History Family History  Problem Relation Age of Onset  . Hypertension Mother     Social History Social History   Tobacco Use  . Smoking status: Never Smoker  . Smokeless tobacco: Never Used  Substance Use Topics  . Alcohol use: Yes    Comment: occasionally  . Drug use: No     Allergies   Monoclonal antibodies and Penicillins   Review of Systems Review of Systems  Constitutional: Positive for appetite change. Negative for fever.  HENT: Negative for congestion.   Respiratory: Negative for shortness of breath.   Gastrointestinal: Positive for abdominal pain, nausea and vomiting. Negative for abdominal distention and constipation.  Genitourinary: Negative for flank pain and genital sores.  Musculoskeletal: Negative for back pain.  Skin: Negative for rash.  Neurological: Negative for seizures and weakness.  Hematological: Negative for adenopathy.  Psychiatric/Behavioral: Negative for confusion.     Physical Exam Updated Vital Signs BP (!) 160/105 (BP  Location: Right Arm)   Pulse (!) 54   Temp 98.6 F (37 C) (Oral)   Resp 18   Ht 5\' 9"  (1.753 m)   Wt 83.9 kg (185 lb)   SpO2 100%   BMI 27.32 kg/m   Physical Exam  Constitutional: He appears well-developed.  HENT:  Head: Atraumatic.  Eyes: Pupils are equal, round, and reactive to light.  Cardiovascular: Regular rhythm.  Pulmonary/Chest: Breath sounds normal.  Abdominal: Normal appearance and bowel sounds are normal.  No abdominal tenderness or mass.  No rebound or guarding.  Neurological: He is alert.  Skin: Skin is warm. Capillary refill takes less than 2 seconds.       ED Treatments / Results  Labs (all labs ordered are listed, but only abnormal results are displayed) Labs Reviewed  COMPREHENSIVE METABOLIC PANEL - Abnormal; Notable for the following components:      Result Value   Creatinine, Ser 1.54 (*)    Total Protein 8.3 (*)    ALT 11 (*)    All other components within normal limits  LIPASE, BLOOD  CBC  URINALYSIS, ROUTINE W REFLEX MICROSCOPIC    EKG  EKG Interpretation None       Radiology Ct Abdomen Pelvis W Contrast  Result Date: 02/03/2017 CLINICAL DATA:  Abdominal pain and nausea. EXAM: CT ABDOMEN AND PELVIS WITH CONTRAST TECHNIQUE: Multidetector CT imaging of the abdomen and pelvis was performed using the standard protocol following bolus administration of intravenous contrast. CONTRAST:  193mL ISOVUE-300 IOPAMIDOL (ISOVUE-300) INJECTION 61% COMPARISON:  None. FINDINGS: Lower chest: The lung bases are clear of acute process. No pleural effusion or pulmonary lesions. The heart is normal in size. No pericardial effusion. The distal esophagus and aorta are unremarkable. Hepatobiliary: No focal hepatic lesions or intrahepatic biliary dilatation. The gallbladder is normal. No common bile duct dilatation. Pancreas: No mass, inflammation or ductal dilatation. Spleen: Normal size.  No focal lesions. Adrenals/Urinary Tract: The adrenal glands are normal. The right kidney is normal. The left kidney demonstrates changes of chronic UPJ obstruction with chronic hydronephrosis with a large kidney with markedly thickened renal cortex. New line masslike area in the midpole region of the left kidney measuring 4.7 cm. This is most likely some type of chronic inflamed or obstructed calyx. Uroepithelial neoplasm would seem unlikely in this age group. Would recommend urology consultation. Stomach/Bowel: The stomach, duodenum, small bowel and colon are unremarkable. The terminal ileum is normal. The appendix is normal. Vascular/Lymphatic: The aorta is normal  in caliber. No dissection. The branch vessels are patent. The major venous structures are patent. No mesenteric or retroperitoneal mass or adenopathy. Small scattered lymph nodes are noted. Reproductive: The prostate gland and seminal vesicles are unremarkable. Other: No pelvic mass or adenopathy. No free pelvic fluid collections. No inguinal mass or adenopathy. No abdominal wall hernia or subcutaneous lesions. Musculoskeletal: No significant bony findings. IMPRESSION: 1. 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 2. 4.7 cm masslike lesion in the midpole region of left kidney laterally is most likely some type of chronic inflammation or obstructed dilated calyx with debris. Uroepithelial neoplasm is less likely but I would recommend urology consultation. 3. No other significant abdominal/pelvic findings. Electronically Signed   By: Marijo Sanes M.D.   On: 02/03/2017 18:10    Procedures Procedures (including critical care time)  Medications Ordered in ED Medications  iopamidol (ISOVUE-300) 61 % injection (100  mLs  Contrast Given 02/03/17 1725)     Initial Impression / Assessment and Plan / ED Course  I have reviewed the triage vital signs and the nursing notes.  Pertinent labs & imaging results that were available during my care of the patient were reviewed by me and considered in my medical decision making (see chart for details).     Patient with abdominal pain.  Has had for months.  Urine reassuring and has mild renal insufficiency.  However found severe hydronephrosis on left side.  Mass versus sediment also in the kidney.  Discussed with Dr.Filippou from urology who reviewed the images.  Will follow up as an outpatient.  Final Clinical Impressions(s) / ED Diagnoses   Final diagnoses:  Hydronephrosis with ureteropelvic junction (UPJ) obstruction    ED  Discharge Orders        Ordered    oxyCODONE-acetaminophen (PERCOCET/ROXICET) 5-325 MG tablet  Every 6 hours PRN     02/03/17 1905    ondansetron (ZOFRAN-ODT) 4 MG disintegrating tablet  Every 8 hours PRN     02/03/17 1905       Davonna Belling, MD 02/03/17 2035

## 2017-02-03 NOTE — ED Notes (Signed)
Patient transported to CT 

## 2017-02-03 NOTE — ED Triage Notes (Signed)
Pt reports sharp pains in abdomen just above belly button x 4-5 months. Pt reports this has been on and off. Denies n/v/d with this episode of pain. Pt states he has been seen at multiple UC for same with no answers.

## 2017-02-03 NOTE — ED Notes (Signed)
Alvino Chapel, MD in room updating pt on plan of care

## 2017-02-18 ENCOUNTER — Other Ambulatory Visit: Payer: Self-pay | Admitting: Urology

## 2017-02-18 DIAGNOSIS — D4102 Neoplasm of uncertain behavior of left kidney: Secondary | ICD-10-CM

## 2017-02-26 ENCOUNTER — Encounter (HOSPITAL_COMMUNITY)
Admission: RE | Admit: 2017-02-26 | Discharge: 2017-02-26 | Disposition: A | Discharge status: Home / Self Care | Payer: BLUE CROSS/BLUE SHIELD | Source: Ambulatory Visit | Attending: Urology | Admitting: Urology

## 2017-02-26 DIAGNOSIS — D4102 Neoplasm of uncertain behavior of left kidney: Secondary | ICD-10-CM

## 2017-02-26 MED ORDER — FUROSEMIDE 10 MG/ML IJ SOLN: 60.0000 mg | Freq: Once | INTRAMUSCULAR | Status: DC

## 2017-02-26 MED ORDER — TECHNETIUM TC 99M MERTIATIDE
4.9000 | Freq: Once | INTRAVENOUS | Status: AC
  Administered 2017-02-26: 4.9 via INTRAVENOUS

## 2017-02-26 MED ORDER — FUROSEMIDE 10 MG/ML IJ SOLN
42.0000 mg | Freq: Once | INTRAMUSCULAR | Status: AC
  Administered 2017-02-26: 42 mg via INTRAVENOUS

## 2017-02-26 MED ORDER — FUROSEMIDE 10 MG/ML IJ SOLN
INTRAMUSCULAR | Status: AC
  Filled 2017-02-26: qty 8

## 2017-03-05 ENCOUNTER — Other Ambulatory Visit: Payer: Self-pay | Admitting: Urology

## 2017-03-06 ENCOUNTER — Other Ambulatory Visit: Payer: Self-pay | Admitting: Urology

## 2017-04-07 NOTE — Patient Instructions (Addendum)
Zephyr Ridley  04/07/2017   Your procedure is scheduled on: 04-16-17   Report to [facility] Main  Entrance Report to Admitting at 6:30 AM   Call this number if you have problems the morning of surgery [phone]   Remember: Do not eat food or drink liquids :After Midnight.   Please consume a Clear Liquid Diet along with your prep   CLEAR LIQUID DIET   Foods Allowed                                                                     Foods Excluded  Coffee and tea, regular and decaf                             liquids that you cannot  Plain Jell-O in any flavor                                             see through such as: Fruit ices (not with fruit pulp)                                     milk, soups, orange juice  Iced Popsicles                                    All solid food Carbonated beverages, regular and diet                                    Cranberry, grape and apple juices Sports drinks like Gatorade Lightly seasoned clear broth or consume(fat free) Sugar, honey syrup  Sample Menu Breakfast                                Lunch                                     Supper Cranberry juice                    Beef broth                            Chicken broth Jell-O                                     Grape juice                           Apple juice Coffee or tea  Jell-O                                      Popsicle                                                Coffee or tea                        Coffee or tea  _____________________________________________________________________     Take these medicines the morning of surgery with A SIP OF WATER: None                                You may not have any metal on your body including hair pins and              piercings  Do not wear jewelry, lotions, powders or deodorant             Men may shave face and neck.   Do not bring valuables to the hospital. [place].  Contacts, dentures or bridgework may not be worn into surgery.  Leave suitcase in the car. After surgery it may be brought to your room.                Please read over the following fact sheets you were given: _____________________________________________________________________         [facility] - Preparing for Surgery Before surgery, you can play an important role.  Because skin is not sterile, your skin needs to be as free of germs as possible.  You can reduce the number of germs on your skin by washing with CHG (chlorahexidine gluconate) soap before surgery.  CHG is an antiseptic cleaner which kills germs and bonds with the skin to continue killing germs even after washing. Please DO NOT use if you have an allergy to CHG or antibacterial soaps.  If your skin becomes reddened/irritated stop using the CHG and inform your nurse when you arrive at Short Stay. Do not shave (including legs and underarms) for at least 48 hours prior to the first CHG shower.  You may shave your face/neck. Please follow these instructions carefully:  1.  Shower with CHG Soap the night before surgery and the  morning of Surgery.  2.  If you choose to wash your hair, wash your hair first as usual with your  normal  shampoo.  3.  After you shampoo, rinse your hair and body thoroughly to remove the  shampoo.                           4.  Use CHG as you would any other liquid soap.  You can apply chg directly  to the skin and wash                       Gently with a scrungie or clean washcloth.  5.  Apply the CHG Soap to your body ONLY FROM THE NECK DOWN.   Do not use on  face/ open                           Wound or open sores. Avoid contact with eyes, ears mouth and genitals (private parts).                       Wash face,  Genitals (private parts) with your normal soap.             6.  Wash thoroughly, paying special attention to the area where your surgery   will be performed.  7.  Thoroughly rinse your body with warm water from the neck down.  8.  DO NOT shower/wash with your normal soap after using and rinsing off  the CHG Soap.                9.  Pat yourself dry with a clean towel.            10.  Wear clean pajamas.            11.  Place clean sheets on your bed the night of your first shower and do not  sleep with pets. Day of Surgery : Do not apply any lotions/deodorants the morning of surgery.  Please wear clean clothes to the hospital/surgery center.  FAILURE TO FOLLOW THESE INSTRUCTIONS MAY RESULT IN THE CANCELLATION OF YOUR SURGERY PATIENT SIGNATURE_________________________________  NURSE SIGNATURE__________________________________  ________________________________________________________________________  WHAT IS A BLOOD TRANSFUSION? Blood Transfusion Information  A transfusion is the replacement of blood or some of its parts. Blood is made up of multiple cells which provide different functions.  Red blood cells carry oxygen and are used for blood loss replacement.  White blood cells fight against infection.  Platelets control bleeding.  Plasma helps clot blood.  Other blood products are available for specialized needs, such as hemophilia or other clotting disorders. BEFORE THE TRANSFUSION  Who gives blood for transfusions?   Healthy volunteers who are fully evaluated to make sure their blood is safe. This is blood bank blood. Transfusion therapy is the safest it has ever been in the practice of medicine. Before blood is taken from a donor, a complete history is taken to make sure that person has no history of diseases nor engages in risky social behavior (examples are intravenous drug use or sexual activity with multiple partners). The donor's travel history is screened to minimize risk of transmitting infections, such as malaria. The donated blood is tested for signs of infectious diseases, such as HIV and hepatitis. The  blood is then tested to be sure it is compatible with you in order to minimize the chance of a transfusion reaction. If you or a relative donates blood, this is often done in anticipation of surgery and is not appropriate for emergency situations. It takes many days to process the donated blood. RISKS AND COMPLICATIONS Although transfusion therapy is very safe and saves many lives, the main dangers of transfusion include:   Getting an infectious disease.  Developing a transfusion reaction. This is an allergic reaction to something in the blood you were given. Every precaution is taken to prevent this. The decision to have a blood transfusion has been considered carefully by your caregiver before blood is given. Blood is not given unless the benefits outweigh the risks. AFTER THE TRANSFUSION  Right after receiving a blood transfusion, you will usually feel much better and more energetic. This is especially true if your  red blood cells have gotten low (anemic). The transfusion raises the level of the red blood cells which carry oxygen, and this usually causes an energy increase.  The nurse administering the transfusion will monitor you carefully for complications. HOME CARE INSTRUCTIONS  No special instructions are needed after a transfusion. You may find your energy is better. Speak with your caregiver about any limitations on activity for underlying diseases you may have. SEEK MEDICAL CARE IF:   Your condition is not improving after your transfusion.  You develop redness or irritation at the intravenous (IV) site. SEEK IMMEDIATE MEDICAL CARE IF:  Any of the following symptoms occur over the next 12 hours:  Shaking chills.  You have a temperature by mouth above 102 F (38.9 C), not controlled by medicine.  Chest, back, or muscle pain.  People around you feel you are not acting correctly or are confused.  Shortness of breath or difficulty breathing.  Dizziness and fainting.  You get  a rash or develop hives.  You have a decrease in urine output.  Your urine turns a dark color or changes to pink, red, or brown. Any of the following symptoms occur over the next 10 days:  You have a temperature by mouth above 102 F (38.9 C), not controlled by medicine.  Shortness of breath.  Weakness after normal activity.  The white part of the eye turns yellow (jaundice).  You have a decrease in the amount of urine or are urinating less often.  Your urine turns a dark color or changes to pink, red, or brown. Document Released: 01/12/2000 Document Revised: 04/08/2011 Document Reviewed: 08/31/2007 [facility] Patient Information 2014 [place], [place].  _______________________________________________________________________

## 2017-04-09 ENCOUNTER — Other Ambulatory Visit: Payer: Self-pay

## 2017-04-09 ENCOUNTER — Encounter (HOSPITAL_COMMUNITY)
Admission: RE | Admit: 2017-04-09 | Discharge: 2017-04-09 | Disposition: A | Discharge status: Home / Self Care | Payer: BLUE CROSS/BLUE SHIELD | Source: Ambulatory Visit | Attending: Urology | Admitting: Urology

## 2017-04-09 ENCOUNTER — Encounter (HOSPITAL_COMMUNITY): Payer: Self-pay

## 2017-04-09 DIAGNOSIS — I10 Essential (primary) hypertension: Secondary | ICD-10-CM | POA: Diagnosis present

## 2017-04-09 DIAGNOSIS — Z01810 Encounter for preprocedural cardiovascular examination: Secondary | ICD-10-CM | POA: Insufficient documentation

## 2017-04-09 DIAGNOSIS — Z01812 Encounter for preprocedural laboratory examination: Secondary | ICD-10-CM | POA: Insufficient documentation

## 2017-04-09 HISTORY — DX: Other complications of anesthesia, initial encounter: T88.59XA

## 2017-04-09 HISTORY — DX: Adverse effect of unspecified anesthetic, initial encounter: T41.45XA

## 2017-04-09 LAB — BASIC METABOLIC PANEL
ANION GAP: 8 (ref 5–15)
BUN: 11 mg/dL (ref 6–20)
CALCIUM: 9.4 mg/dL (ref 8.9–10.3)
CO2: 26 mmol/L (ref 22–32)
CREATININE: 1.21 mg/dL (ref 0.61–1.24)
Chloride: 104 mmol/L (ref 101–111)
Glucose, Bld: 84 mg/dL (ref 65–99)
Potassium: 4.6 mmol/L (ref 3.5–5.1)
SODIUM: 138 mmol/L (ref 135–145)

## 2017-04-09 LAB — CBC
HCT: 39.7 % (ref 39.0–52.0)
Hemoglobin: 13.3 g/dL (ref 13.0–17.0)
MCH: 28.2 pg (ref 26.0–34.0)
MCHC: 33.5 g/dL (ref 30.0–36.0)
MCV: 84.3 fL (ref 78.0–100.0)
PLATELETS: 208 10*3/uL (ref 150–400)
RBC: 4.71 MIL/uL (ref 4.22–5.81)
RDW: 13.7 % (ref 11.5–15.5)
WBC: 2.9 10*3/uL — AB (ref 4.0–10.5)

## 2017-04-09 LAB — ABO/RH: ABO/RH(D): O POS

## 2017-04-09 NOTE — Progress Notes (Signed)
04-09-17 CBC result routed to Dr. Tresa Moore for review.

## 2017-04-15 NOTE — Anesthesia Preprocedure Evaluation (Addendum)
Anesthesia Evaluation  Patient identified by MRN, date of birth, ID band Patient awake    Reviewed: Allergy & Precautions, NPO status , Patient's Chart, lab work & pertinent test results  Airway Mallampati: II  TM Distance: >3 FB Neck ROM: Full    Dental  (+) Dental Advisory Given   Pulmonary neg pulmonary ROS,    breath sounds clear to auscultation       Cardiovascular negative cardio ROS   Rhythm:Regular Rate:Normal     Neuro/Psych negative neurological ROS     GI/Hepatic negative GI ROS, Neg liver ROS,   Endo/Other  negative endocrine ROS  Renal/GU Atrophic left kidney with left renal mass     Musculoskeletal   Abdominal   Peds  Hematology negative hematology ROS (+) Sickle cell trait ,   Anesthesia Other Findings   Reproductive/Obstetrics                            Lab Results  Component Value Date   WBC 2.9 (L) 04/09/2017   HGB 13.3 04/09/2017   HCT 39.7 04/09/2017   MCV 84.3 04/09/2017   PLT 208 04/09/2017   Lab Results  Component Value Date   CREATININE 1.21 04/09/2017   BUN 11 04/09/2017   NA 138 04/09/2017   K 4.6 04/09/2017   CL 104 04/09/2017   CO2 26 04/09/2017    Anesthesia Physical Anesthesia Plan  ASA: II  Anesthesia Plan: General   Post-op Pain Management:    Induction: Intravenous  PONV Risk Score and Plan: 2 and Ondansetron, Dexamethasone and Treatment may vary due to age or medical condition  Airway Management Planned: Oral ETT  Additional Equipment:   Intra-op Plan:   Post-operative Plan: Extubation in OR  Informed Consent: I have reviewed the patients History and Physical, chart, labs and discussed the procedure including the risks, benefits and alternatives for the proposed anesthesia with the patient or authorized representative who has indicated his/her understanding and acceptance.   Dental advisory given  Plan Discussed with:  CRNA  Anesthesia Plan Comments:        Anesthesia Quick Evaluation

## 2017-04-16 ENCOUNTER — Inpatient Hospital Stay (HOSPITAL_COMMUNITY)
Admission: RE | Admit: 2017-04-16 | Discharge: 2017-04-18 | DRG: 661 | Disposition: A | Discharge status: Home / Self Care | Payer: BLUE CROSS/BLUE SHIELD | Source: Ambulatory Visit | Attending: Urology | Admitting: Urology

## 2017-04-16 ENCOUNTER — Inpatient Hospital Stay (HOSPITAL_COMMUNITY): Payer: BLUE CROSS/BLUE SHIELD | Admitting: Anesthesiology

## 2017-04-16 ENCOUNTER — Other Ambulatory Visit: Payer: Self-pay

## 2017-04-16 ENCOUNTER — Encounter (HOSPITAL_COMMUNITY)
Admission: RE | Disposition: A | Discharge status: Home / Self Care | Payer: Self-pay | Source: Ambulatory Visit | Attending: Urology

## 2017-04-16 ENCOUNTER — Encounter (HOSPITAL_COMMUNITY): Payer: Self-pay | Admitting: *Deleted

## 2017-04-16 DIAGNOSIS — N130 Hydronephrosis with ureteropelvic junction obstruction: Secondary | ICD-10-CM | POA: Diagnosis present

## 2017-04-16 DIAGNOSIS — Z880 Allergy status to penicillin: Secondary | ICD-10-CM | POA: Diagnosis not present

## 2017-04-16 DIAGNOSIS — Z888 Allergy status to other drugs, medicaments and biological substances status: Secondary | ICD-10-CM | POA: Diagnosis not present

## 2017-04-16 DIAGNOSIS — N261 Atrophy of kidney (terminal): Secondary | ICD-10-CM | POA: Diagnosis present

## 2017-04-16 DIAGNOSIS — N2889 Other specified disorders of kidney and ureter: Secondary | ICD-10-CM | POA: Diagnosis present

## 2017-04-16 DIAGNOSIS — N289 Disorder of kidney and ureter, unspecified: Secondary | ICD-10-CM | POA: Diagnosis present

## 2017-04-16 DIAGNOSIS — D573 Sickle-cell trait: Secondary | ICD-10-CM | POA: Diagnosis present

## 2017-04-16 HISTORY — PX: ROBOT ASSISTED LAPAROSCOPIC NEPHRECTOMY: SHX5140

## 2017-04-16 LAB — TYPE AND SCREEN
ABO/RH(D): O POS
Antibody Screen: NEGATIVE

## 2017-04-16 SURGERY — NEPHRECTOMY, RADICAL, ROBOT-ASSISTED, LAPAROSCOPIC, ADULT
Anesthesia: General | Laterality: Left

## 2017-04-16 MED ORDER — SUCCINYLCHOLINE CHLORIDE 200 MG/10ML IV SOSY
PREFILLED_SYRINGE | INTRAVENOUS | Status: AC
  Filled 2017-04-16: qty 10

## 2017-04-16 MED ORDER — SODIUM CHLORIDE 0.9 % IJ SOLN
INTRAMUSCULAR | Status: AC
  Filled 2017-04-16: qty 10

## 2017-04-16 MED ORDER — MIDAZOLAM HCL 2 MG/2ML IJ SOLN
INTRAMUSCULAR | Status: AC
  Filled 2017-04-16: qty 2

## 2017-04-16 MED ORDER — ROCURONIUM BROMIDE 10 MG/ML (PF) SYRINGE
PREFILLED_SYRINGE | INTRAVENOUS | Status: DC | PRN
  Administered 2017-04-16: 20 mg via INTRAVENOUS
  Administered 2017-04-16: 60 mg via INTRAVENOUS

## 2017-04-16 MED ORDER — OXYCODONE HCL 5 MG PO TABS
5.0000 mg | ORAL_TABLET | ORAL | Status: DC | PRN
  Administered 2017-04-16 – 2017-04-17 (×5): 5 mg via ORAL
  Filled 2017-04-16 (×5): qty 1

## 2017-04-16 MED ORDER — ONDANSETRON HCL 4 MG/2ML IJ SOLN
INTRAMUSCULAR | Status: DC | PRN
  Administered 2017-04-16: 4 mg via INTRAVENOUS

## 2017-04-16 MED ORDER — PROPOFOL 10 MG/ML IV BOLUS
INTRAVENOUS | Status: DC | PRN
  Administered 2017-04-16: 180 mg via INTRAVENOUS

## 2017-04-16 MED ORDER — SUFENTANIL CITRATE 50 MCG/ML IV SOLN
INTRAVENOUS | Status: AC
  Filled 2017-04-16: qty 1

## 2017-04-16 MED ORDER — LACTATED RINGERS IR SOLN
Status: DC | PRN
  Administered 2017-04-16: 1000 mL

## 2017-04-16 MED ORDER — OXYCODONE-ACETAMINOPHEN 5-325 MG PO TABS: 1.0000 | ORAL_TABLET | Freq: Four times a day (QID) | ORAL | 0 refills | Status: AC | PRN

## 2017-04-16 MED ORDER — DEXAMETHASONE SODIUM PHOSPHATE 10 MG/ML IJ SOLN
INTRAMUSCULAR | Status: AC
  Filled 2017-04-16: qty 1

## 2017-04-16 MED ORDER — ONDANSETRON HCL 4 MG/2ML IJ SOLN: 4.0000 mg | Freq: Once | INTRAMUSCULAR | Status: DC | PRN

## 2017-04-16 MED ORDER — SUGAMMADEX SODIUM 200 MG/2ML IV SOLN
INTRAVENOUS | Status: AC
  Filled 2017-04-16: qty 2

## 2017-04-16 MED ORDER — SODIUM CHLORIDE 0.9 % IJ SOLN
INTRAMUSCULAR | Status: AC
  Filled 2017-04-16: qty 20

## 2017-04-16 MED ORDER — SUGAMMADEX SODIUM 200 MG/2ML IV SOLN
INTRAVENOUS | Status: DC | PRN
  Administered 2017-04-16: 200 mg via INTRAVENOUS

## 2017-04-16 MED ORDER — HYDROMORPHONE HCL 1 MG/ML IJ SOLN
INTRAMUSCULAR | Status: AC
  Filled 2017-04-16: qty 2

## 2017-04-16 MED ORDER — ACETAMINOPHEN 500 MG PO TABS
1000.0000 mg | ORAL_TABLET | Freq: Four times a day (QID) | ORAL | Status: AC
  Administered 2017-04-16 – 2017-04-17 (×4): 1000 mg via ORAL
  Filled 2017-04-16 (×4): qty 2

## 2017-04-16 MED ORDER — MAGNESIUM CITRATE PO SOLN: 1.0000 | Freq: Once | ORAL | Status: DC

## 2017-04-16 MED ORDER — MIDAZOLAM HCL 2 MG/2ML IJ SOLN
INTRAMUSCULAR | Status: DC | PRN
  Administered 2017-04-16: 2 mg via INTRAVENOUS

## 2017-04-16 MED ORDER — LIDOCAINE 2% (20 MG/ML) 5 ML SYRINGE
INTRAMUSCULAR | Status: DC | PRN
  Administered 2017-04-16: 100 mg via INTRAVENOUS

## 2017-04-16 MED ORDER — PROPOFOL 10 MG/ML IV BOLUS
INTRAVENOUS | Status: AC
  Filled 2017-04-16: qty 20

## 2017-04-16 MED ORDER — DIPHENHYDRAMINE HCL 50 MG/ML IJ SOLN: 12.5000 mg | Freq: Four times a day (QID) | INTRAMUSCULAR | Status: DC | PRN

## 2017-04-16 MED ORDER — STERILE WATER FOR IRRIGATION IR SOLN
Status: DC | PRN
  Administered 2017-04-16: 1000 mL

## 2017-04-16 MED ORDER — DEXAMETHASONE SODIUM PHOSPHATE 10 MG/ML IJ SOLN
INTRAMUSCULAR | Status: DC | PRN
  Administered 2017-04-16: 10 mg via INTRAVENOUS

## 2017-04-16 MED ORDER — HYDROMORPHONE HCL 1 MG/ML IJ SOLN
0.2500 mg | INTRAMUSCULAR | Status: DC | PRN
  Administered 2017-04-16: 0.25 mg via INTRAVENOUS
  Administered 2017-04-16: 0.5 mg via INTRAVENOUS
  Administered 2017-04-16: 0.25 mg via INTRAVENOUS
  Administered 2017-04-16: 0.5 mg via INTRAVENOUS
  Administered 2017-04-16 (×2): 0.25 mg via INTRAVENOUS

## 2017-04-16 MED ORDER — ONDANSETRON HCL 4 MG/2ML IJ SOLN
INTRAMUSCULAR | Status: AC
  Filled 2017-04-16: qty 2

## 2017-04-16 MED ORDER — ROCURONIUM BROMIDE 10 MG/ML (PF) SYRINGE
PREFILLED_SYRINGE | INTRAVENOUS | Status: AC
  Filled 2017-04-16: qty 5

## 2017-04-16 MED ORDER — HYDRALAZINE HCL 20 MG/ML IJ SOLN
INTRAMUSCULAR | Status: AC
  Filled 2017-04-16: qty 1

## 2017-04-16 MED ORDER — SODIUM CHLORIDE 0.9 % IJ SOLN
INTRAMUSCULAR | Status: DC | PRN
  Administered 2017-04-16: 20 mL

## 2017-04-16 MED ORDER — HYDRALAZINE HCL 20 MG/ML IJ SOLN
5.0000 mg | Freq: Once | INTRAMUSCULAR | Status: AC
  Administered 2017-04-16: 5 mg via INTRAVENOUS

## 2017-04-16 MED ORDER — SUFENTANIL CITRATE 50 MCG/ML IV SOLN
INTRAVENOUS | Status: DC | PRN
  Administered 2017-04-16 (×2): 10 ug via INTRAVENOUS
  Administered 2017-04-16: 20 ug via INTRAVENOUS
  Administered 2017-04-16 (×2): 10 ug via INTRAVENOUS

## 2017-04-16 MED ORDER — CIPROFLOXACIN IN D5W 400 MG/200ML IV SOLN
400.0000 mg | INTRAVENOUS | Status: AC
  Administered 2017-04-16: 400 mg via INTRAVENOUS
  Filled 2017-04-16: qty 200

## 2017-04-16 MED ORDER — LACTATED RINGERS IV SOLN
INTRAVENOUS | Status: DC
  Administered 2017-04-16 (×2): via INTRAVENOUS

## 2017-04-16 MED ORDER — ONDANSETRON HCL 4 MG/2ML IJ SOLN
4.0000 mg | INTRAMUSCULAR | Status: DC | PRN
  Administered 2017-04-16 – 2017-04-17 (×4): 4 mg via INTRAVENOUS
  Filled 2017-04-16 (×4): qty 2

## 2017-04-16 MED ORDER — HYDROMORPHONE HCL 1 MG/ML IJ SOLN
0.5000 mg | INTRAMUSCULAR | Status: DC | PRN
  Administered 2017-04-16 – 2017-04-17 (×3): 1 mg via INTRAVENOUS
  Filled 2017-04-16 (×3): qty 1

## 2017-04-16 MED ORDER — BUPIVACAINE LIPOSOME 1.3 % IJ SUSP
20.0000 mL | Freq: Once | INTRAMUSCULAR | Status: AC
  Administered 2017-04-16: 20 mL
  Filled 2017-04-16: qty 20

## 2017-04-16 MED ORDER — DIPHENHYDRAMINE HCL 12.5 MG/5ML PO ELIX: 12.5000 mg | ORAL_SOLUTION | Freq: Four times a day (QID) | ORAL | Status: DC | PRN

## 2017-04-16 MED ORDER — LIDOCAINE 2% (20 MG/ML) 5 ML SYRINGE
INTRAMUSCULAR | Status: AC
  Filled 2017-04-16: qty 5

## 2017-04-16 MED ORDER — DEXTROSE-NACL 5-0.45 % IV SOLN
INTRAVENOUS | Status: DC
  Administered 2017-04-16 – 2017-04-17 (×2): via INTRAVENOUS

## 2017-04-16 MED ORDER — CLINDAMYCIN PHOSPHATE 900 MG/50ML IV SOLN
900.0000 mg | Freq: Once | INTRAVENOUS | Status: AC
  Administered 2017-04-16: 900 mg via INTRAVENOUS
  Filled 2017-04-16: qty 50

## 2017-04-16 SURGICAL SUPPLY — 58 items
BAG LAPAROSCOPIC 12 15 PORT 16 (BASKET) ×1 IMPLANT
BAG RETRIEVAL 12/15 (BASKET) ×2
BAG RETRIEVAL 12/15MM (BASKET) ×1
CHLORAPREP W/TINT 26ML (MISCELLANEOUS) ×3 IMPLANT
CLIP VESOLOCK LG 6/CT PURPLE (CLIP) ×6 IMPLANT
CLIP VESOLOCK MED LG 6/CT (CLIP) ×3 IMPLANT
CLIP VESOLOCK XL 6/CT (CLIP) ×3 IMPLANT
COVER SURGICAL LIGHT HANDLE (MISCELLANEOUS) ×3 IMPLANT
COVER TIP SHEARS 8 DVNC (MISCELLANEOUS) ×1 IMPLANT
COVER TIP SHEARS 8MM DA VINCI (MISCELLANEOUS) ×2
CUTTER ECHEON FLEX ENDO 45 340 (ENDOMECHANICALS) ×3 IMPLANT
DECANTER SPIKE VIAL GLASS SM (MISCELLANEOUS) ×3 IMPLANT
DERMABOND ADVANCED (GAUZE/BANDAGES/DRESSINGS) ×4
DERMABOND ADVANCED .7 DNX12 (GAUZE/BANDAGES/DRESSINGS) ×2 IMPLANT
DRAIN CHANNEL 15F RND FF 3/16 (WOUND CARE) IMPLANT
DRAPE ARM DVNC X/XI (DISPOSABLE) ×4 IMPLANT
DRAPE COLUMN DVNC XI (DISPOSABLE) ×1 IMPLANT
DRAPE DA VINCI XI ARM (DISPOSABLE) ×8
DRAPE DA VINCI XI COLUMN (DISPOSABLE) ×2
DRAPE INCISE IOBAN 66X45 STRL (DRAPES) ×3 IMPLANT
DRAPE LAPAROSCOPIC ABDOMINAL (DRAPES) IMPLANT
DRAPE SHEET LG 3/4 BI-LAMINATE (DRAPES) ×3 IMPLANT
ELECT PENCIL ROCKER SW 15FT (MISCELLANEOUS) ×3 IMPLANT
ELECT REM PT RETURN 15FT ADLT (MISCELLANEOUS) ×3 IMPLANT
EVACUATOR SILICONE 100CC (DRAIN) IMPLANT
GLOVE BIO SURGEON STRL SZ 6.5 (GLOVE) ×2 IMPLANT
GLOVE BIO SURGEONS STRL SZ 6.5 (GLOVE) ×1
GLOVE BIOGEL M STRL SZ7.5 (GLOVE) ×6 IMPLANT
GOWN STRL REUS W/TWL LRG LVL3 (GOWN DISPOSABLE) ×12 IMPLANT
IRRIG SUCT STRYKERFLOW 2 WTIP (MISCELLANEOUS) ×3
IRRIGATION SUCT STRKRFLW 2 WTP (MISCELLANEOUS) ×1 IMPLANT
KIT BASIN OR (CUSTOM PROCEDURE TRAY) ×3 IMPLANT
LOOP VESSEL MAXI BLUE (MISCELLANEOUS) ×3 IMPLANT
NEEDLE INSUFFLATION 14GA 120MM (NEEDLE) ×3 IMPLANT
PORT ACCESS TROCAR AIRSEAL 12 (TROCAR) ×1 IMPLANT
PORT ACCESS TROCAR AIRSEAL 5M (TROCAR) ×2
POSITIONER SURGICAL ARM (MISCELLANEOUS) ×6 IMPLANT
RELOAD STAPLER WHITE 60MM (STAPLE) IMPLANT
SEAL CANN UNIV 5-8 DVNC XI (MISCELLANEOUS) ×4 IMPLANT
SEAL XI 5MM-8MM UNIVERSAL (MISCELLANEOUS) ×8
SET TRI-LUMEN FLTR TB AIRSEAL (TUBING) ×3 IMPLANT
SOLUTION ELECTROLUBE (MISCELLANEOUS) ×3 IMPLANT
SPONGE LAP 4X18 X RAY DECT (DISPOSABLE) ×3 IMPLANT
STAPLE ECHEON FLEX 60 POW ENDO (STAPLE) IMPLANT
STAPLE RELOAD 45 WHT (STAPLE) ×5 IMPLANT
STAPLE RELOAD 45MM WHITE (STAPLE) ×10
STAPLER RELOAD WHITE 60MM (STAPLE)
SUT ETHILON 3 0 PS 1 (SUTURE) IMPLANT
SUT MNCRL AB 4-0 PS2 18 (SUTURE) ×6 IMPLANT
SUT PDS AB 1 CT1 27 (SUTURE) ×12 IMPLANT
SUT VICRYL 0 UR6 27IN ABS (SUTURE) IMPLANT
TOWEL OR 17X26 10 PK STRL BLUE (TOWEL DISPOSABLE) ×3 IMPLANT
TOWEL OR NON WOVEN STRL DISP B (DISPOSABLE) ×6 IMPLANT
TRAY FOLEY W/METER SILVER 16FR (SET/KITS/TRAYS/PACK) ×3 IMPLANT
TRAY LAPAROSCOPIC (CUSTOM PROCEDURE TRAY) ×3 IMPLANT
TROCAR BLADELESS OPT 12M 100M (ENDOMECHANICALS) ×3 IMPLANT
TROCAR BLADELESS OPT 5 100 (ENDOMECHANICALS) IMPLANT
TROCAR XCEL 12X100 BLDLESS (ENDOMECHANICALS) ×3 IMPLANT

## 2017-04-16 NOTE — H&P (Signed)
Dustin Mendoza is an 27 y.o. male.    Chief Complaint: Pre-Op LEFT Radical Nephrectomy  HPI:   1 - Left UPJ Obstruction / Atrophic Kidney - large left hydronephrosis with severe parenchymal atrophy by contrast CT 01/2017 from ER. 1 artery / 1 vein (large lumbar noted below artery) renovascular anatomy. No obvious crossing vessels. Renogram 2019 confirms severely compromised function at <25% by my interpretation of flow curves.   2 - Rule Out Left Renal Malignancy - unusual conglomerate of hyperdense (?enhancing as >80HU) material in left upper outer calyx by CT 01/2017. He has sickle trait.   3 - Stage 3 Renal Insufficiency - Cr 1.5 by ER labs 01/2017. Left atrophic kidney as per above.   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 Preserved sexual function post shunt and no recurrent priapism.   PMH sig for sickle trait. NO CV disease / blood thinners.   Today " Dustin Mendoza " is seen to proceed with LEFT robotic radical nephrectomy.    Past Medical History:  Diagnosis Date  . Complication of anesthesia    Nausea  . Heart murmur   . Hypertension   . Sickle cell trait [facility])     Past Surgical History:  Procedure Laterality Date  . PRIAPISM REPAIR  2015    Family History  Problem Relation Age of Onset  . Hypertension Mother    Social History:  reports that  has never smoked. he has never used smokeless tobacco. He reports that he drinks alcohol. He reports that he does not use drugs.  Allergies:  Allergies  Allergen Reactions  . Monoclonal Antibodies Hives  . Penicillins Hives    Has patient had a PCN reaction causing immediate rash, facial/tongue/throat swelling, SOB or lightheadedness with hypotension: No Has patient had a PCN reaction causing severe rash involving mucus membranes or skin necrosis: Yes Has patient had a PCN reaction that required hospitalization: No Has patient had a PCN reaction occurring within the last 10 years: No If all of the above answers are  "NO", then may proceed with Cephalosporin use.     Medications Prior to Admission  Medication Sig Dispense Refill  . acetaminophen (TYLENOL) 650 MG CR tablet Take 650 mg by mouth daily as needed for pain.    Marland Kitchen ondansetron (ZOFRAN-ODT) 4 MG disintegrating tablet Take 1 tablet (4 mg total) by mouth every 8 (eight) hours as needed for nausea or vomiting. 8 tablet 0  . oxyCODONE-acetaminophen (PERCOCET/ROXICET) 5-325 MG tablet Take 1-2 tablets by mouth every 6 (six) hours as needed. (Patient taking differently: Take 1 tablet by mouth every 6 (six) hours as needed for moderate pain. ) 8 tablet 0    No results found for this or any previous visit (from the past 48 hour(s)). No results found.  Review of Systems  Constitutional: Negative.  Negative for chills and fever.  HENT: Negative.   Eyes: Negative.   Respiratory: Negative.   Cardiovascular: Negative.   Gastrointestinal: Negative.   Musculoskeletal: Negative.   Skin: Negative.   Neurological: Negative.   Endo/Heme/Allergies: Negative.   Psychiatric/Behavioral: Negative.     Blood pressure 120/80, pulse (!) 49, temperature 98.7 F (37.1 C), temperature source Oral, resp. rate 18, height 5\' 9"  (1.753 m), weight 83 kg (183 lb), SpO2 100 %. Physical Exam  Constitutional: He is oriented to person, place, and time. He appears well-developed.  Very athletic physique  Eyes: Pupils are equal, round, and reactive to light.  Neck: Normal range of  motion.  Cardiovascular: Normal rate.  Respiratory: Effort normal.  GI: Soft.  Genitourinary:  Genitourinary Comments: NO CVAT at present.   Musculoskeletal: Normal range of motion.  Neurological: He is alert and oriented to person, place, and time.  Skin: Skin is warm.  Psychiatric: He has a normal mood and affect.     Assessment/Plan  Proceed as planned with LEFT robotic radical nephrectomy for atrophic kidney with concern for malignancy. Risks, benefits, alternatives, expected peri-op  course discussed previously and reiterated today.     Alexis Frock, MD 04/16/2017, 8:05 AM

## 2017-04-16 NOTE — Op Note (Signed)
NAME:  Dustin Mendoza, Dustin Mendoza                  ACCOUNT NO.:  MEDICAL RECORD NO.:  [id number]  LOCATION:                                 FACILITY:  PHYSICIAN:  Alexis Frock, MD          DATE OF BIRTH:  DATE OF PROCEDURE: 04/16/2017                               OPERATIVE REPORT   DIAGNOSIS:  Left atrophic kidney with enhancing mass, concern for possible kidney cancer.  PROCEDURE:  Robotic-assisted laparoscopic left radical nephrectomy.  ESTIMATED BLOOD LOSS:  100 cc.  COMPLICATION:  None.  SPECIMEN:  Left kidney and adrenal en bloc.  ASSISTANT:  [name], PA  FINDINGS: 1. Single artery, single vein, left renovascular anatomy as     anticipated. 2. Predominantly medial blood supply to the left adrenal gland. 3. No obviously locally advanced or distant disease.  INDICATION:  The patient is a very pleasant 27 year old young man, who was found on workup of abdominal pain and questionable hematuria to have an atrophic left kidney with an enhancing mass within it and then he underwent nuclear medicine renography, which revealed some function, but minimal of the left kidney.  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  Options were discussed for management including recommended path of left radical nephrectomy given his atrophic kidney and concern for malignancy and he wished to proceed.  Informed consent was obtained and placed in the medical record.  PROCEDURE IN DETAIL:  The patient being, the patient, was verified. Procedure being left radical nephrectomy was confirmed.  Procedure was carried out.  Time-out was performed.  Intravenous antibiotics were administered.  General endotracheal anesthesia was introduced.  The patient was placed into a left side up, full flank position, employing 15 degrees of stable flexion, superior arm elevator,  axillary roll, sequential compression devices, bottom leg bent, top leg straight.  He was further fashioned to the operating table using a beanbag and then 3- inch tape over foam padding across his supraxiphoid chest and his pelvis.  Foley catheter had previously been placed.  Sterile field was created by prepping and draping the patient's entire left flank using chlorhexidine gluconate.  A high-flow, low pressure pneumoperitoneum was obtained using Veress technique in the left lower quadrant having passed the aspiration and drop test.  An 8-mm robotic camera port was then placed in position approximately 4 fingerbreadths superolateral to the umbilicus.  Laparoscopic examination of the peritoneal cavity revealed no significant adhesions and no visceral injury.  Additional ports were then placed as follows:  Left subcostal 8-mm robotic port, left far lateral 8-mm robotic port approximately 3 fingerbreadths superomedial to the anterior superior iliac spine.  Left paramedian inferior robotic port approximately 4 fingerbreadths superior to the pubic ramus and two 12-mm assistant ports in the midline; one in the infraumbilical crease and another one 2 fingerbreadths above the camera port, the superior one being an AirSeal.  Robot was docked and passed through the electronic checks.  Initial attention was directed at development of retroperitoneum.  Incision was made lateral to the  descending colon from the area of the splenic flexure towards the area of the internal ring and then, the colon was carefully swept medially.  Given the patient's extreme lack of adiposity, the mesentery was quite thin.  Exquisite care was taken not to injure this and the lower pole of the kidney area was identified, placed on gentle lateral traction.  Dissection proceeded medial to this.  The ureter and gonadal vessels were encountered, placed on gentle lateral traction.  Dissection proceeded within the triangle  of the ureter, gonadal vessels, aorta and psoas muscle towards the area of the renal hilum.  Renal hilum consisted of a single vein, single artery renovascular anatomy as anticipated.  The artery was quite superior. The artery was controlled using extra-large clip proximal with vascular load stapler distal.  The vein was controlled using vascular load stapler.  Given the potential for aggressive histology, it was felt that complete ipsilateral adrenalectomy was warranted.  As such, the plane was chosen just lateral to the aorta, but medial to the adrenal for further dissection.  The patient's adrenal vasculature appeared to be predominantly medial in nature and this was controlled using vascular load stapler.  Superior attachments were taken down with purposely wide dissection using cautery scissors after lateral attachments.  The ureter was doubly clipped and ligated.  The gonadal vessels were controlled using vascular load stapler, this completely freed up the left radical nephrectomy specimen, which was placed in an EndoCatch bag for later retrieval.  Robot was then undocked.  Sponge and needle counts were correct.  Hemostasis appeared excellent.  Specimen was retrieved by extending the previous 12-mm port site in the midline and connecting them.  The left radical nephrectomy specimen was set aside for permanent pathology.  The extraction site was closed at the level of the fascia using figure-of-eight PDS x8, reapproximation of Scarpa's with running Vicryl.  All incision sites were infiltrated with dilute lyophilized Marcaine and closed at the level of the skin using subcuticular Monocryl followed by Dermabond.  Procedure was then terminated.  The patient tolerated the procedure well.  There were no immediate periprocedural complications.  The patient was taken to the postanesthesia care unit in stable condition.  Please note, first assistant, [name], was absolutely crucial  for all portions of the procedure today.  She provided invaluable intraoperative retraction, suctioning, vascular clipping, vascular stapling; without which, this would not be possible.          ______________________________ Alexis Frock, MD     TM/MEDQ  D:  04/16/2017  T:  04/16/2017  Job:  [id number]

## 2017-04-16 NOTE — Anesthesia Postprocedure Evaluation (Signed)
Anesthesia Post Note  Patient: Dustin Mendoza  Procedure(s) Performed: XI ROBOTIC ASSISTED LAPAROSCOPIC NEPHRECTOMY (Left )     Patient location during evaluation: PACU Anesthesia Type: General Level of consciousness: awake and alert Pain management: pain level controlled Vital Signs Assessment: post-procedure vital signs reviewed and stable Respiratory status: spontaneous breathing, nonlabored ventilation, respiratory function stable and patient connected to nasal cannula oxygen Cardiovascular status: blood pressure returned to baseline and stable Postop Assessment: no apparent nausea or vomiting Anesthetic complications: no    Last Vitals:  Vitals:   04/16/17 1215 04/16/17 1230  BP: (!) 159/97 (!) 155/88  Pulse: 70 73  Resp: 20 16  Temp:    SpO2: 100% 100%    Last Pain:  Vitals:   04/16/17 1132  TempSrc:   PainSc: 10-Worst pain ever                 Tiajuana Amass

## 2017-04-16 NOTE — Discharge Instructions (Signed)

## 2017-04-16 NOTE — Progress Notes (Signed)
Patient ambulated around 50 feet in hallway with moderate assist. Tolerated fair. Pain and nausea under control at this time. No other complaints. Pt resting comfortably in bed at this time.

## 2017-04-16 NOTE — Transfer of Care (Signed)
Immediate Anesthesia Transfer of Care Note  Patient: Dustin Mendoza  Procedure(s) Performed: XI ROBOTIC ASSISTED LAPAROSCOPIC NEPHRECTOMY (Left )  Patient Location: PACU  Anesthesia Type:General  Level of Consciousness: sedated  Airway & Oxygen Therapy: Patient Spontanous Breathing and Patient connected to face mask oxygen  Post-op Assessment: Report given to RN and Post -op Vital signs reviewed and stable  Post vital signs: Reviewed and stable  Last Vitals:  Vitals:   04/16/17 0639  BP: 120/80  Pulse: (!) 49  Resp: 18  Temp: 37.1 C  SpO2: 100%    Last Pain:  Vitals:   04/16/17 0639  TempSrc: Oral      Patients Stated Pain Goal: 4 (74/71/59 [id number])  Complications: No apparent anesthesia complications

## 2017-04-16 NOTE — Anesthesia Procedure Notes (Signed)
Procedure Name: Intubation Date/Time: 04/16/2017 8:31 AM Performed by: Sharlette Dense, CRNA Patient Re-evaluated:Patient Re-evaluated prior to induction Oxygen Delivery Method: Circle system utilized Preoxygenation: Pre-oxygenation with 100% oxygen Induction Type: IV induction Ventilation: Mask ventilation without difficulty and Oral airway inserted - appropriate to patient size Laryngoscope Size: Miller and 3 Grade View: Grade I Tube type: Oral Tube size: 8.0 mm Number of attempts: 1 Airway Equipment and Method: Stylet Placement Confirmation: ETT inserted through vocal cords under direct vision,  positive ETCO2 and breath sounds checked- equal and bilateral Secured at: 22 cm Tube secured with: Tape Dental Injury: Teeth and Oropharynx as per pre-operative assessment

## 2017-04-16 NOTE — Brief Op Note (Signed)
04/16/2017  10:37 AM  PATIENT:  Dustin Mendoza  27 y.o. male  PRE-OPERATIVE DIAGNOSIS:  LEFT RENAL MASS, ATROPHIC KIDNEY  POST-OPERATIVE DIAGNOSIS:  LEFT RENAL MASS, ATROPHIC KIDNEY  PROCEDURE:  Procedure(s): XI ROBOTIC ASSISTED LAPAROSCOPIC NEPHRECTOMY (Left)  SURGEON:  Surgeon(s) and Role:    Alexis Frock, MD - Primary  PHYSICIAN ASSISTANT:   ASSISTANTS: Clemetine Marker PQ   ANESTHESIA:   local and general  EBL:  50 mL   BLOOD ADMINISTERED:none  DRAINS: foley to gravity   LOCAL MEDICATIONS USED:  MARCAINE     SPECIMEN:  Source of Specimen:  left kidney + adrenal en bloc  DISPOSITION OF SPECIMEN:  PATHOLOGY  COUNTS:  YES  TOURNIQUET:  * No tourniquets in log *  DICTATION: .Other Dictation: Dictation Number  [id number]  PLAN OF CARE: Admit to inpatient   PATIENT DISPOSITION:  PACU - hemodynamically stable.   Delay start of Pharmacological VTE agent (>24hrs) due to surgical blood loss or risk of bleeding: yes

## 2017-04-17 ENCOUNTER — Encounter (HOSPITAL_COMMUNITY): Payer: Self-pay | Admitting: Urology

## 2017-04-17 LAB — BASIC METABOLIC PANEL
Anion gap: 10 (ref 5–15)
BUN: 15 mg/dL (ref 6–20)
CALCIUM: 9.7 mg/dL (ref 8.9–10.3)
CO2: 24 mmol/L (ref 22–32)
Chloride: 100 mmol/L — ABNORMAL LOW (ref 101–111)
Creatinine, Ser: 1.46 mg/dL — ABNORMAL HIGH (ref 0.61–1.24)
GFR calc Af Amer: >60 mL/min (ref >60)
GLUCOSE: 124 mg/dL — AB (ref 65–99)
POTASSIUM: 5.3 mmol/L — AB (ref 3.5–5.1)
Sodium: 134 mmol/L — ABNORMAL LOW (ref 135–145)

## 2017-04-17 LAB — HEMOGLOBIN AND HEMATOCRIT, BLOOD
HCT: 40.8 % (ref 39.0–52.0)
Hemoglobin: 13.9 g/dL (ref 13.0–17.0)

## 2017-04-17 MED ORDER — BISACODYL 10 MG RE SUPP
10.0000 mg | Freq: Once | RECTAL | Status: AC
  Administered 2017-04-17: 10 mg via RECTAL
  Filled 2017-04-17: qty 1

## 2017-04-17 MED ORDER — ALUM & MAG HYDROXIDE-SIMETH 200-200-20 MG/5ML PO SUSP
30.0000 mL | ORAL | Status: DC | PRN
  Administered 2017-04-17: 30 mL via ORAL
  Filled 2017-04-17 (×2): qty 30

## 2017-04-17 NOTE — Progress Notes (Signed)
Patient had a minimal amount of serosanguineous drainage from midline incision, ~size of a pinpoint. Patient requesting a dressing, RN covered incision with gauze. Will continue to monitor drainage amount.

## 2017-04-17 NOTE — Progress Notes (Signed)
Rosalyn Gess, NP on call paged regarding pt's pain still 8/10 despite 5 mg Oxy IR @ 1636. New orders given for suppository and maalox. Will carry out new orders and continue to monitor pt closely. Carnella [place] I

## 2017-04-17 NOTE — Progress Notes (Signed)
1 Day Post-Op Subjective: Patient feels well overall.  Still having a good bit of pain/soreness.  He has not passed flatus.  Denies N/V/dizziness.  He is ambulating without difficulty. Pt voiding after foley removed this morning.   Objective: Vital signs in last 24 hours: Temp:  [98 F (36.7 C)-98.6 F (37 C)] 98.2 F (36.8 C) (03/21 1615) Pulse Rate:  [45-66] 51 (03/21 1615) Resp:  [16-19] 19 (03/21 1615) BP: (128-167)/(77-104) 144/104 (03/21 1615) SpO2:  [99 %-100 %] 100 % (03/21 1615)  Intake/Output from previous day: 03/20 0701 - 03/21 0700 In: 2299.4 [P.O.:480; I.V.:1819.4] Out: 2375 [Urine:2325; Blood:50] Intake/Output this shift: Total I/O In: 240 [P.O.:240] Out: 550 [Urine:550]  Physical Exam:  General:alert, cooperative and no distress  Cardiac:  RRR Lungs: CTA bilateral GI: soft, mild tenderness, ND, faint BS Incisions: C/D/I   Lab Results: Recent Labs    04/17/17 0524  HGB 13.9  HCT 40.8   BMET Recent Labs    04/17/17 0524  NA 134*  K 5.3*  CL 100*  CO2 24  GLUCOSE 124*  BUN 15  CREATININE 1.46*  CALCIUM 9.7   No results for input(s): LABPT, INR in the last 72 hours. No results for input(s): LABURIN in the last 72 hours. Results for orders placed or performed during the hospital encounter of 11/26/13  Rapid strep screen   (If patient has fever and/or without cough or runny nose)     Status: None   Collection Time: 11/26/13  1:51 PM  Result Value Ref Range Status   Streptococcus, Group A Screen (Direct) NEGATIVE NEGATIVE Final    Comment: (NOTE) A Rapid Antigen test may result negative if the antigen level in the sample is below the detection level of this test. The FDA has not cleared this test as a stand-alone test therefore the rapid antigen negative result has reflexed to a Group A Strep culture.  Culture, Group A Strep     Status: None   Collection Time: 11/26/13  1:51 PM  Result Value Ref Range Status   Specimen Description THROAT   Final   Special Requests NONE  Final   Culture   Final    No Beta Hemolytic Streptococci Isolated Performed at [facility]    Report Status 11/28/2013 FINAL  Final    Studies/Results: No results found.  Assessment/Plan: 1 Day Post-Op Procedure(s) (LRB): XI ROBOTIC ASSISTED LAPAROSCOPIC NEPHRECTOMY (Left)  Pt is doing well POD 1.    BP is fluctuating which is not unusual after nephrectomy in young, healthy individual. He is asymptomatic. Continue to monitor.  Continue to ambulate in halls, I.S.  Oral pain meds only.    Advance diet to softs   Plan d/c in am    LOS: 1 day   Dustin Mendoza 04/17/2017, 5:17 PM

## 2017-04-17 NOTE — Progress Notes (Signed)
Discontinued foley at [place]. Informed patient to use urinal so we can have an accurate count of his urine output.  Patient understood.  Also encouraged patient to ambulate multiple times today as well as incentive spirometer use every hour while awake.  Patient agreeable.

## 2017-04-18 MED ORDER — BISACODYL 10 MG RE SUPP
10.0000 mg | Freq: Once | RECTAL | Status: AC
  Administered 2017-04-18: 10 mg via RECTAL
  Filled 2017-04-18: qty 1

## 2017-04-18 NOTE — Care Management Note (Signed)
Case Management Note  Patient Details  Name: Dustin Mendoza MRN: [id number] Date of Birth: 08-Aug-1990  Subjective/Objective:                    Action/Plan:d/c home.   Expected Discharge Date:  04/18/17               Expected Discharge Plan:  Home/Self Care  In-House Referral:     Discharge planning Services  CM Consult  Post Acute Care Choice:    Choice offered to:     DME Arranged:    DME Agency:     HH Arranged:    HH Agency:     Status of Service:  Completed, signed off  If discussed at [employer] of Stay Meetings, dates discussed:    Additional Comments:  Dessa Phi, RN 04/18/2017, 10:49 AM

## 2017-04-18 NOTE — Discharge Summary (Signed)
Date of admission: 04/16/2017  Date of discharge: 04/18/2017  Admission diagnosis: left UPJ obstruction/atrophic kidney  Discharge diagnosis: same  Secondary diagnoses: HTN, priapism, sickle cell trait, heart murmur  History and Physical: For full details, please see admission history and physical. Briefly, Dustin Mendoza is a 27 y.o. year old patient with Left UPJ Obstruction / Atrophic Kidney - large left hydronephrosis with severe parenchymal atrophy by contrast CT 01/2017 from ER. 1 artery / 1 vein (large lumbar noted below artery) renovascular anatomy. No obvious crossing vessels. Renogram 2019 confirms severely compromised function at <25% by my interpretation of flow curves.   2 - Rule Out Left Renal Malignancy - unusual conglomerate of hyperdense (?enhancing as >80HU) material in left upper outer calyx by CT 01/2017. He has sickle trait.   3 - Stage 3 Renal Insufficiency - Cr 1.5 by ER labs 01/2017. Left atrophic kidney as per above.   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 Preserved sexual function post shunt and no recurrent priapism.     Hospital Course: Pt was admitted and taken to the OR on 04/16/17 for a robotic assisted lap left radical nephrectomy.  Pt tolerated the procedure well and was hemodynamically stable throughout the procedure.  He was extubated without complication and woke up from anesthesia neurologically intact.  He was transferred from the OR to PACU and then to the floor without difficulty.  His post op course progressed as expected.  Foley was removed on POD 1 and he was able to void without issue.  He began ambulating and using IS on POD 1 as well.  Pain was well controlled and he was switched to oral pain meds on POD 1.  He began passing flatus on POD 2. Incisions remained C/D/I and abdomen soft with no distention.  By POD 2 he was ambulating, tolerating a soft diet , and felt stable for d/c home.    Laboratory values:  Recent Labs    04/17/17 0524   HGB 13.9  HCT 40.8   Recent Labs    04/17/17 0524  CREATININE 1.46*    Disposition: Home  Discharge instruction: The patient was instructed to be ambulatory but told to refrain from heavy lifting, strenuous activity, or driving.   Discharge medications:  Allergies as of 04/18/2017      Reactions   Monoclonal Antibodies Hives   Penicillins Hives   Has patient had a PCN reaction causing immediate rash, facial/tongue/throat swelling, SOB or lightheadedness with hypotension: No Has patient had a PCN reaction causing severe rash involving mucus membranes or skin necrosis: Yes Has patient had a PCN reaction that required hospitalization: No Has patient had a PCN reaction occurring within the last 10 years: No If all of the above answers are "NO", then may proceed with Cephalosporin use.      Medication List    TAKE these medications   acetaminophen 650 MG CR tablet Commonly known as:  TYLENOL Take 650 mg by mouth daily as needed for pain.   ondansetron 4 MG disintegrating tablet Commonly known as:  ZOFRAN-ODT Take 1 tablet (4 mg total) by mouth every 8 (eight) hours as needed for nausea or vomiting.   oxyCODONE-acetaminophen 5-325 MG tablet Commonly known as:  PERCOCET/ROXICET Take 1-2 tablets by mouth every 6 (six) hours as needed for moderate pain or severe pain. What changed:  reasons to take this       Followup:  Follow-up Information    Alexis Frock, MD On 05/01/2017.  Specialty:  Urology Why:  at 11AM for MD visit. Dr. Tresa Moore will call you with pathology results when available.  Contact information: [place] [address] [phone]

## 2019-01-15 IMAGING — NM NM RENAL IMAGING FLOW W/ PHARM
2 series · 12 of 12 positions shown · non-contrast
Comparison: CT abdomen and pelvis 02/03/2017

CLINICAL DATA: LEFT hydronephrosis, question inflammatory versus
neoplastic mass LEFT kidney

EXAM:
NUCLEAR MEDICINE RENAL SCAN WITH DIURETIC ADMINISTRATION
TECHNIQUE: Radionuclide angiographic and sequential renal images were obtained
after intravenous injection of radiopharmaceutical. Imaging was
continued during slow intravenous injection of Lasix approximately
15 minutes after the start of the examination.
RADIOPHARMACEUTICALS:  4.9 mCi Oechnetium-UUm MAG3 IV
Pharmaceutical: 42 mg Lasix IV at 20 minutes into exam

[Series 1: renal scan · 4.14mm/px · 6 of 90 frames shown (1 of 2)]
[frame 8/90]
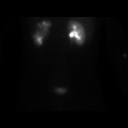
[frame 23/90]
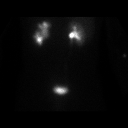
[frame 38/90]
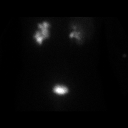
[frame 53/90]
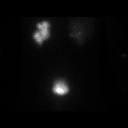
[frame 68/90]
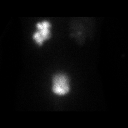
[frame 83/90]
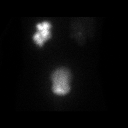

[Series 1: renal scan · 4.14mm/px · 6 of 40 frames shown (2 of 2)]
[frame 4/40  full-range]
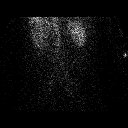
[frame 10/40  full-range]
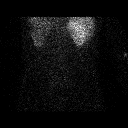
[frame 17/40  full-range]
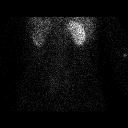
[frame 24/40  full-range]
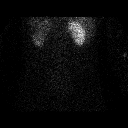
[frame 30/40  full-range]
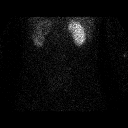
[frame 37/40  full-range]
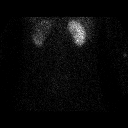

[12 of 12 positions shown; findings below may reference images not displayed]

FINDINGS: Flow: Slightly delayed and overall diminished blood flow to LEFT
kidney versus RIGHT.

Left renogram: Delayed uptake and concentration of tracer by LEFT
kidney. Excretion of tracer into a dilated LEFT renal collecting
system. Poor washout of tracer during the exam with significant
retention at the conclusion of the exam study. Renogram curve
demonstrates a continually climbing curve without significant
washout of tracer. No significant response to diuresis.

Right renogram: Normal uptake, concentration and excretion of tracer
by RIGHT kidney. Normal time to peak activity of 4.2 minutes with
fall to half maximum 9.8 minutes later. Additional washout following
Lasix.

Differential:

Left kidney = 29 %

Right kidney = 71 %

T1/2 post Lasix :

Left kidney = N/A min

Right kidney = 4.2 min
IMPRESSION: Normal RIGHT renogram.

Markedly dilated LEFT renal collecting system with significantly
impaired washout of tracer despite diuretic administration
compatible with significant urinary outflow obstruction.

## 2023-06-30 ENCOUNTER — Ambulatory Visit
Admission: EM | Admit: 2023-06-30 | Discharge: 2023-06-30 | Disposition: A | Discharge status: Home / Self Care | Payer: Self-pay | Attending: Family Medicine | Admitting: Family Medicine

## 2023-06-30 DIAGNOSIS — M109 Gout, unspecified: Secondary | ICD-10-CM

## 2023-06-30 DIAGNOSIS — R030 Elevated blood-pressure reading, without diagnosis of hypertension: Secondary | ICD-10-CM

## 2023-06-30 MED ORDER — HYDROCHLOROTHIAZIDE 12.5 MG PO TABS: 12.5000 mg | ORAL_TABLET | Freq: Every day | ORAL | 0 refills | Status: AC

## 2023-06-30 MED ORDER — PREDNISONE 10 MG PO TABS: 30.0000 mg | ORAL_TABLET | Freq: Every day | ORAL | 0 refills | Status: DC

## 2023-06-30 NOTE — ED Triage Notes (Signed)
 Patient states right foot pain for several days. No known injuries.

## 2023-06-30 NOTE — ED Provider Notes (Signed)
 [facility]  Note:  This document was prepared using Conservation officer, historic buildings and may include unintentional dictation errors.  MRN: [id number] DOB: 1990/10/27  Subjective:   Dustin Mendoza is a 33 y.o. male presenting for 3 to 4-day history of recurrent right foot pain, right ankle pain.  Patient reports that he is concerned about having a recurrent gout flare.  Has 1-2 episodes per year.  Has made significant dietary changes, has rare alcohol drink.  Usually responds well with prednisone.  Has a solitary kidney.  Has had history of elevated creatinine levels but has not had a checkup in years.  No chronic medications.  Allergies  Allergen Reactions   Monoclonal Antibodies Hives   Penicillins Hives    Has patient had a PCN reaction causing immediate rash, facial/tongue/throat swelling, SOB or lightheadedness with hypotension: No Has patient had a PCN reaction causing severe rash involving mucus membranes or skin necrosis: Yes Has patient had a PCN reaction that required hospitalization: No Has patient had a PCN reaction occurring within the last 10 years: No If all of the above answers are "NO", then may proceed with Cephalosporin use.     Past Medical History:  Diagnosis Date   Complication of anesthesia    Nausea   Heart murmur    Hypertension    Sickle cell trait (HCC)      Past Surgical History:  Procedure Laterality Date   PRIAPISM REPAIR  2015   ROBOT ASSISTED LAPAROSCOPIC NEPHRECTOMY Left 04/16/2017   Procedure: XI ROBOTIC ASSISTED LAPAROSCOPIC NEPHRECTOMY;  Surgeon: Osborn Blaze, MD;  Location: [facility];  Service: Urology;  Laterality: Left;    Family History  Problem Relation Age of Onset   Hypertension Mother     Social History   Tobacco Use   Smoking status: Never   Smokeless tobacco: Never  Vaping Use   Vaping status: Never Used  Substance Use Topics   Alcohol use: Yes    Comment: socially   Drug use: No     ROS   Objective:   Vitals: BP (!) 160/113 (BP Location: Left Arm)   Pulse 65   Temp 98 F (36.7 C) (Oral)   Resp 18   SpO2 96%   BP Readings from Last 3 Encounters:  06/30/23 (!) 160/113  04/18/17 134/89  04/09/17 127/80    Physical Exam Constitutional:      General: He is not in acute distress.    Appearance: Normal appearance. He is well-developed and normal weight. He is not ill-appearing, toxic-appearing or diaphoretic.  HENT:     Head: Normocephalic and atraumatic.     Right Ear: External ear normal.     Left Ear: External ear normal.     Nose: Nose normal.     Mouth/Throat:     Pharynx: Oropharynx is clear.  Eyes:     General: No scleral icterus.       Right eye: No discharge.        Left eye: No discharge.     Extraocular Movements: Extraocular movements intact.  Cardiovascular:     Rate and Rhythm: Normal rate.  Pulmonary:     Effort: Pulmonary effort is normal.  Musculoskeletal:     Cervical back: Normal range of motion.       Feet:  Neurological:     Mental Status: He is alert and oriented to person, place, and time.  Psychiatric:        Mood and Affect: Mood  normal.        Behavior: Behavior normal.        Thought Content: Thought content normal.        Judgment: Judgment normal.     Assessment and Plan :   PDMP not reviewed this encounter.  1. Acute gout of right foot, unspecified cause   2. Elevated blood pressure reading without diagnosis of hypertension    Recommended an oral prednisone course of 30 mg for 5 days.  Commend this treatment with hydrochlorothiazide  as an effort to avoid the effects on blood pressure that steroids can have.  Continue to avoid gout causing foods.  Counseled patient on potential for adverse effects with medications prescribed/recommended today, ER and return-to-clinic precautions discussed, patient verbalized understanding.    Adolph Hoop, [place] 06/30/23 1942

## 2023-10-20 ENCOUNTER — Ambulatory Visit
Admission: EM | Admit: 2023-10-20 | Discharge: 2023-10-20 | Disposition: A | Discharge status: Home / Self Care | Attending: Family Medicine | Admitting: Family Medicine

## 2023-10-20 DIAGNOSIS — M109 Gout, unspecified: Secondary | ICD-10-CM

## 2023-10-20 MED ORDER — PREDNISONE 10 MG PO TABS: 30.0000 mg | ORAL_TABLET | Freq: Every day | ORAL | 0 refills | Status: AC

## 2023-10-20 NOTE — ED Provider Notes (Signed)
 [facility]  Note:  This document was prepared using Conservation officer, historic buildings and may include unintentional dictation errors.  MRN: [id number] DOB: Oct 26, 1990  Subjective:   Dustin Mendoza is a 33 y.o. male presenting for 3-day history of recurrent left foot pain, left ankle pain.  Has had a history of gout affecting either the right or left foot.  No fall, trauma.  No wounds.  Has used Aleve and has a history of an atrophic kidney.  Avoids gout causing foods and drinks.  Previously responded well to the use of prednisone .  No current facility-administered medications for this encounter.  Current Outpatient Medications:    acetaminophen  (TYLENOL ) 650 MG CR tablet, Take 650 mg by mouth daily as needed for pain., Disp: , Rfl:    hydrochlorothiazide  (HYDRODIURIL ) 12.5 MG tablet, Take 1 tablet (12.5 mg total) by mouth daily., Disp: 5 tablet, Rfl: 0   ondansetron  (ZOFRAN -ODT) 4 MG disintegrating tablet, Take 1 tablet (4 mg total) by mouth every 8 (eight) hours as needed for nausea or vomiting., Disp: 8 tablet, Rfl: 0   oxyCODONE -acetaminophen  (PERCOCET/ROXICET) 5-325 MG tablet, Take 1-2 tablets by mouth every 6 (six) hours as needed for moderate pain or severe pain., Disp: 30 tablet, Rfl: 0   predniSONE  (DELTASONE ) 10 MG tablet, Take 3 tablets (30 mg total) by mouth daily with breakfast., Disp: 15 tablet, Rfl: 0   Allergies  Allergen Reactions   Monoclonal Antibodies Hives   Penicillins Hives    Has patient had a PCN reaction causing immediate rash, facial/tongue/throat swelling, SOB or lightheadedness with hypotension: No Has patient had a PCN reaction causing severe rash involving mucus membranes or skin necrosis: Yes Has patient had a PCN reaction that required hospitalization: No Has patient had a PCN reaction occurring within the last 10 years: No If all of the above answers are NO, then may proceed with Cephalosporin use.     Past Medical History:   Diagnosis Date   Complication of anesthesia    Nausea   Heart murmur    Hypertension    Sickle cell trait      Past Surgical History:  Procedure Laterality Date   PRIAPISM REPAIR  2015   ROBOT ASSISTED LAPAROSCOPIC NEPHRECTOMY Left 04/16/2017   Procedure: XI ROBOTIC ASSISTED LAPAROSCOPIC NEPHRECTOMY;  Surgeon: Alvaro Hummer, MD;  Location: [facility];  Service: Urology;  Laterality: Left;    Family History  Problem Relation Age of Onset   Hypertension Mother     Social History   Tobacco Use   Smoking status: Never   Smokeless tobacco: Never  Vaping Use   Vaping status: Never Used  Substance Use Topics   Alcohol use: Yes    Comment: socially   Drug use: No    ROS   Objective:   Vitals: BP (!) 145/87 (BP Location: Right Arm)   Pulse (!) 55   Temp 98.8 F (37.1 C) (Oral)   Resp 17   SpO2 98%   Physical Exam Constitutional:      General: He is not in acute distress.    Appearance: Normal appearance. He is well-developed and normal weight. He is not ill-appearing, toxic-appearing or diaphoretic.  HENT:     Head: Normocephalic and atraumatic.     Right Ear: External ear normal.     Left Ear: External ear normal.     Nose: Nose normal.     Mouth/Throat:     Pharynx: Oropharynx is clear.  Eyes:  General: No scleral icterus.       Right eye: No discharge.        Left eye: No discharge.     Extraocular Movements: Extraocular movements intact.  Cardiovascular:     Rate and Rhythm: Normal rate.  Pulmonary:     Effort: Pulmonary effort is normal.  Musculoskeletal:     Cervical back: Normal range of motion.     Left ankle: Swelling present. No deformity, ecchymosis or lacerations. Tenderness present over the lateral malleolus, ATF ligament and AITF ligament. No medial malleolus, CF ligament, posterior TF ligament, base of 5th metatarsal or proximal fibula tenderness. Normal range of motion.     Left Achilles Tendon: No tenderness or defects. Thompson's test  negative.     Left foot: Normal range of motion and normal capillary refill. No swelling, laceration, tenderness, bony tenderness or crepitus.  Neurological:     Mental Status: He is alert and oriented to person, place, and time.  Psychiatric:        Mood and Affect: Mood normal.        Behavior: Behavior normal.        Thought Content: Thought content normal.        Judgment: Judgment normal.     Assessment and Plan :   PDMP not reviewed this encounter.  1. Acute gout of left ankle, unspecified cause    Will manage gout with prednisone .  Recommend low purine diet.  Counseled patient on potential for adverse effects with medications prescribed/recommended today, ER and return-to-clinic precautions discussed, patient verbalized understanding.    Christopher Savannah, [place] 10/20/23 1630

## 2023-10-20 NOTE — ED Triage Notes (Signed)
 Pt c/o pain to left foot x 3 days-states feels like gout-taking aleve and drinking black cherry juice-NAD-steady gait
